# Patient Record
Sex: Female | Born: 1985 | Race: Black or African American | Hispanic: No | Marital: Single | Smoking: Never smoker
Health system: Southern US, Community
[De-identification: ages and names within clinical notes are randomized; demographics above are authoritative.]

## PROBLEM LIST (undated history)

## (undated) DIAGNOSIS — N200 Calculus of kidney: Secondary | ICD-10-CM

## (undated) DIAGNOSIS — Z87442 Personal history of urinary calculi: Secondary | ICD-10-CM

## (undated) DIAGNOSIS — G43909 Migraine, unspecified, not intractable, without status migrainosus: Secondary | ICD-10-CM

## (undated) DIAGNOSIS — D649 Anemia, unspecified: Secondary | ICD-10-CM

## (undated) DIAGNOSIS — M7989 Other specified soft tissue disorders: Secondary | ICD-10-CM

## (undated) DIAGNOSIS — M1990 Unspecified osteoarthritis, unspecified site: Secondary | ICD-10-CM

## (undated) HISTORY — DX: Unspecified osteoarthritis, unspecified site: M19.90

## (undated) HISTORY — DX: Anemia, unspecified: D64.9

---
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
## 2004-12-17 ENCOUNTER — Inpatient Hospital Stay (HOSPITAL_COMMUNITY): Admission: AD | Admit: 2004-12-17 | Discharge: 2004-12-17 | Payer: Self-pay | Admitting: Obstetrics & Gynecology

## 2005-02-24 ENCOUNTER — Ambulatory Visit (HOSPITAL_COMMUNITY): Admission: RE | Admit: 2005-02-24 | Discharge: 2005-02-24 | Payer: Self-pay | Admitting: *Deleted

## 2005-04-20 ENCOUNTER — Ambulatory Visit (HOSPITAL_COMMUNITY): Admission: RE | Admit: 2005-04-20 | Discharge: 2005-04-20 | Payer: Self-pay | Admitting: *Deleted

## 2005-05-03 ENCOUNTER — Inpatient Hospital Stay (HOSPITAL_COMMUNITY): Admission: AD | Admit: 2005-05-03 | Discharge: 2005-05-03 | Payer: Self-pay | Admitting: *Deleted

## 2005-05-17 ENCOUNTER — Ambulatory Visit (HOSPITAL_COMMUNITY): Admission: RE | Admit: 2005-05-17 | Discharge: 2005-05-17 | Payer: Self-pay | Admitting: *Deleted

## 2005-06-22 ENCOUNTER — Ambulatory Visit: Payer: Self-pay | Admitting: Obstetrics and Gynecology

## 2005-06-29 ENCOUNTER — Inpatient Hospital Stay (HOSPITAL_COMMUNITY): Admission: AD | Admit: 2005-06-29 | Discharge: 2005-07-01 | Payer: Self-pay | Admitting: Obstetrics and Gynecology

## 2005-06-29 ENCOUNTER — Ambulatory Visit: Payer: Self-pay | Admitting: Family Medicine

## 2006-10-05 ENCOUNTER — Emergency Department (HOSPITAL_COMMUNITY): Admission: EM | Admit: 2006-10-05 | Discharge: 2006-10-05 | Payer: Self-pay | Admitting: *Deleted

## 2007-03-09 ENCOUNTER — Emergency Department (HOSPITAL_COMMUNITY): Admission: EM | Admit: 2007-03-09 | Discharge: 2007-03-10 | Payer: Self-pay | Admitting: Emergency Medicine

## 2007-12-11 ENCOUNTER — Emergency Department (HOSPITAL_COMMUNITY): Admission: EM | Admit: 2007-12-11 | Discharge: 2007-12-11 | Payer: Self-pay | Admitting: Emergency Medicine

## 2007-12-29 ENCOUNTER — Emergency Department (HOSPITAL_COMMUNITY): Admission: EM | Admit: 2007-12-29 | Discharge: 2007-12-29 | Payer: Self-pay | Admitting: Emergency Medicine

## 2008-02-21 ENCOUNTER — Inpatient Hospital Stay (HOSPITAL_COMMUNITY): Admission: AD | Admit: 2008-02-21 | Discharge: 2008-02-21 | Payer: Self-pay | Admitting: Obstetrics & Gynecology

## 2008-05-11 ENCOUNTER — Inpatient Hospital Stay (HOSPITAL_COMMUNITY): Admission: AD | Admit: 2008-05-11 | Discharge: 2008-05-12 | Payer: Self-pay | Admitting: Obstetrics and Gynecology

## 2008-07-18 HISTORY — PX: TUBAL LIGATION: SHX77

## 2008-08-10 ENCOUNTER — Inpatient Hospital Stay (HOSPITAL_COMMUNITY): Admission: AD | Admit: 2008-08-10 | Discharge: 2008-08-10 | Payer: Self-pay | Admitting: Obstetrics & Gynecology

## 2008-10-23 ENCOUNTER — Ambulatory Visit (HOSPITAL_COMMUNITY): Admission: RE | Admit: 2008-10-23 | Discharge: 2008-10-23 | Payer: Self-pay | Admitting: Family Medicine

## 2008-11-03 ENCOUNTER — Observation Stay (HOSPITAL_COMMUNITY): Admission: AD | Admit: 2008-11-03 | Discharge: 2008-11-05 | Payer: Self-pay | Admitting: Obstetrics & Gynecology

## 2008-11-03 ENCOUNTER — Ambulatory Visit: Payer: Self-pay | Admitting: Obstetrics & Gynecology

## 2008-11-12 ENCOUNTER — Ambulatory Visit: Payer: Self-pay | Admitting: Obstetrics & Gynecology

## 2008-11-13 ENCOUNTER — Encounter: Payer: Self-pay | Admitting: Family

## 2008-12-03 ENCOUNTER — Encounter: Payer: Self-pay | Admitting: Family

## 2008-12-03 ENCOUNTER — Ambulatory Visit: Payer: Self-pay | Admitting: Obstetrics & Gynecology

## 2008-12-03 LAB — CONVERTED CEMR LAB
HCT: 33.9 % — ABNORMAL LOW (ref 36.0–46.0)
Hemoglobin: 10.9 g/dL — ABNORMAL LOW (ref 12.0–15.0)
MCHC: 32.2 g/dL (ref 30.0–36.0)
MCV: 84.1 fL (ref 78.0–100.0)
RBC: 4.03 M/uL (ref 3.87–5.11)
WBC: 6.3 10*3/uL (ref 4.0–10.5)

## 2008-12-24 ENCOUNTER — Ambulatory Visit: Payer: Self-pay | Admitting: Obstetrics & Gynecology

## 2008-12-25 ENCOUNTER — Ambulatory Visit: Payer: Self-pay | Admitting: Advanced Practice Midwife

## 2008-12-25 ENCOUNTER — Inpatient Hospital Stay (HOSPITAL_COMMUNITY): Admission: AD | Admit: 2008-12-25 | Discharge: 2008-12-26 | Payer: Self-pay | Admitting: Obstetrics & Gynecology

## 2009-01-07 ENCOUNTER — Ambulatory Visit: Payer: Self-pay | Admitting: Obstetrics & Gynecology

## 2009-01-07 ENCOUNTER — Encounter: Payer: Self-pay | Admitting: Obstetrics and Gynecology

## 2009-01-08 ENCOUNTER — Inpatient Hospital Stay (HOSPITAL_COMMUNITY): Admission: AD | Admit: 2009-01-08 | Discharge: 2009-01-08 | Payer: Self-pay | Admitting: Obstetrics & Gynecology

## 2009-01-08 ENCOUNTER — Ambulatory Visit: Payer: Self-pay | Admitting: Advanced Practice Midwife

## 2009-01-16 ENCOUNTER — Ambulatory Visit: Payer: Self-pay | Admitting: Obstetrics and Gynecology

## 2009-01-16 ENCOUNTER — Inpatient Hospital Stay (HOSPITAL_COMMUNITY): Admission: AD | Admit: 2009-01-16 | Discharge: 2009-01-17 | Payer: Self-pay | Admitting: Obstetrics & Gynecology

## 2009-01-21 ENCOUNTER — Ambulatory Visit: Payer: Self-pay | Admitting: Obstetrics & Gynecology

## 2009-02-04 ENCOUNTER — Ambulatory Visit: Payer: Self-pay | Admitting: Obstetrics & Gynecology

## 2009-02-04 ENCOUNTER — Encounter: Payer: Self-pay | Admitting: Obstetrics and Gynecology

## 2009-02-04 LAB — CONVERTED CEMR LAB
Chlamydia, DNA Probe: POSITIVE — AB
GC Probe Amp, Genital: NEGATIVE

## 2009-02-11 ENCOUNTER — Ambulatory Visit: Payer: Self-pay | Admitting: Obstetrics & Gynecology

## 2009-02-18 ENCOUNTER — Ambulatory Visit: Payer: Self-pay | Admitting: Obstetrics & Gynecology

## 2009-02-25 ENCOUNTER — Ambulatory Visit: Payer: Self-pay | Admitting: Obstetrics & Gynecology

## 2009-03-02 ENCOUNTER — Ambulatory Visit: Payer: Self-pay | Admitting: Obstetrics & Gynecology

## 2009-03-03 ENCOUNTER — Ambulatory Visit: Payer: Self-pay | Admitting: Obstetrics & Gynecology

## 2009-03-03 ENCOUNTER — Inpatient Hospital Stay (HOSPITAL_COMMUNITY): Admission: RE | Admit: 2009-03-03 | Discharge: 2009-03-06 | Payer: Self-pay | Admitting: Family Medicine

## 2010-08-07 ENCOUNTER — Emergency Department (HOSPITAL_COMMUNITY)
Admission: EM | Admit: 2010-08-07 | Discharge: 2010-08-07 | Payer: Self-pay | Source: Home / Self Care | Admitting: Family Medicine

## 2010-09-27 ENCOUNTER — Ambulatory Visit (INDEPENDENT_AMBULATORY_CARE_PROVIDER_SITE_OTHER): Payer: Self-pay

## 2010-09-27 ENCOUNTER — Inpatient Hospital Stay (INDEPENDENT_AMBULATORY_CARE_PROVIDER_SITE_OTHER)
Admission: RE | Admit: 2010-09-27 | Discharge: 2010-09-27 | Disposition: A | Discharge status: Home / Self Care | Payer: Self-pay | Source: Ambulatory Visit | Attending: Family Medicine | Admitting: Family Medicine

## 2010-09-27 DIAGNOSIS — M67919 Unspecified disorder of synovium and tendon, unspecified shoulder: Secondary | ICD-10-CM

## 2010-09-27 DIAGNOSIS — M719 Bursopathy, unspecified: Secondary | ICD-10-CM

## 2010-10-23 LAB — POCT URINALYSIS DIP (DEVICE)
Ketones, ur: NEGATIVE mg/dL
Nitrite: NEGATIVE
Protein, ur: 30 mg/dL — AB
Protein, ur: NEGATIVE mg/dL
Specific Gravity, Urine: 1.01 (ref 1.005–1.030)
Urobilinogen, UA: 0.2 mg/dL (ref 0.0–1.0)
Urobilinogen, UA: 0.2 mg/dL (ref 0.0–1.0)
pH: 7 (ref 5.0–8.0)

## 2010-10-23 LAB — RH IMMUNE GLOB WKUP(>/=20WKS)(NOT WOMEN'S HOSP): Fetal Screen: NEGATIVE

## 2010-10-23 LAB — CBC
HCT: 29.8 % — ABNORMAL LOW (ref 36.0–46.0)
Hemoglobin: 9.7 g/dL — ABNORMAL LOW (ref 12.0–15.0)
RBC: 3.8 MIL/uL — ABNORMAL LOW (ref 3.87–5.11)
RDW: 16.4 % — ABNORMAL HIGH (ref 11.5–15.5)
WBC: 9.1 10*3/uL (ref 4.0–10.5)

## 2010-10-24 LAB — URINE CULTURE
Colony Count: 5000
Special Requests: NEGATIVE

## 2010-10-24 LAB — COMPREHENSIVE METABOLIC PANEL
ALT: 10 U/L (ref 0–35)
CO2: 25 mEq/L (ref 19–32)
Calcium: 8.7 mg/dL (ref 8.4–10.5)
GFR calc non Af Amer: >60 mL/min (ref >60)
Glucose, Bld: 76 mg/dL (ref 70–99)
Sodium: 134 mEq/L — ABNORMAL LOW (ref 135–145)

## 2010-10-24 LAB — POCT URINALYSIS DIP (DEVICE)
Bilirubin Urine: NEGATIVE
Glucose, UA: NEGATIVE mg/dL
Ketones, ur: NEGATIVE mg/dL
Nitrite: NEGATIVE
Nitrite: NEGATIVE
Nitrite: NEGATIVE
Protein, ur: NEGATIVE mg/dL
Protein, ur: NEGATIVE mg/dL
Specific Gravity, Urine: 1.015 (ref 1.005–1.030)
Urobilinogen, UA: 0.2 mg/dL (ref 0.0–1.0)
Urobilinogen, UA: 0.2 mg/dL (ref 0.0–1.0)
Urobilinogen, UA: 0.2 mg/dL (ref 0.0–1.0)
pH: 7 (ref 5.0–8.0)
pH: 7 (ref 5.0–8.0)
pH: 7 (ref 5.0–8.0)

## 2010-10-24 LAB — URINALYSIS, ROUTINE W REFLEX MICROSCOPIC
Bilirubin Urine: NEGATIVE
Glucose, UA: NEGATIVE mg/dL
Ketones, ur: NEGATIVE mg/dL
Leukocytes, UA: NEGATIVE
Nitrite: NEGATIVE
Protein, ur: 30 mg/dL — AB
Specific Gravity, Urine: 1.015 (ref 1.005–1.030)
Urobilinogen, UA: 0.2 mg/dL (ref 0.0–1.0)
pH: 7 (ref 5.0–8.0)

## 2010-10-24 LAB — CBC
HCT: 31.2 % — ABNORMAL LOW (ref 36.0–46.0)
Hemoglobin: 10.2 g/dL — ABNORMAL LOW (ref 12.0–15.0)
MCHC: 32.8 g/dL (ref 30.0–36.0)
MCV: 82.2 fL (ref 78.0–100.0)
Platelets: 193 K/uL (ref 150–400)
RBC: 3.8 MIL/uL — ABNORMAL LOW (ref 3.87–5.11)
RDW: 14.1 % (ref 11.5–15.5)
WBC: 11 K/uL — ABNORMAL HIGH (ref 4.0–10.5)

## 2010-10-24 LAB — URINE MICROSCOPIC-ADD ON

## 2010-10-25 LAB — URINALYSIS, ROUTINE W REFLEX MICROSCOPIC
Bilirubin Urine: NEGATIVE
Protein, ur: 30 mg/dL — AB
Urobilinogen, UA: 0.2 mg/dL (ref 0.0–1.0)

## 2010-10-25 LAB — WET PREP, GENITAL
Clue Cells Wet Prep HPF POC: NONE SEEN
Yeast Wet Prep HPF POC: NONE SEEN

## 2010-10-25 LAB — URINE CULTURE: Colony Count: 15000

## 2010-10-25 LAB — URINE MICROSCOPIC-ADD ON

## 2010-10-25 LAB — POCT URINALYSIS DIP (DEVICE)
Nitrite: NEGATIVE
Protein, ur: 30 mg/dL — AB
Specific Gravity, Urine: 1.015 (ref 1.005–1.030)
Urobilinogen, UA: 0.2 mg/dL (ref 0.0–1.0)
Urobilinogen, UA: 0.2 mg/dL (ref 0.0–1.0)
pH: 6.5 (ref 5.0–8.0)
pH: 7 (ref 5.0–8.0)

## 2010-10-26 LAB — POCT URINALYSIS DIP (DEVICE)
Bilirubin Urine: NEGATIVE
Glucose, UA: NEGATIVE mg/dL
Nitrite: NEGATIVE

## 2010-10-27 LAB — URINALYSIS, ROUTINE W REFLEX MICROSCOPIC
Leukocytes, UA: NEGATIVE
Nitrite: NEGATIVE
Specific Gravity, Urine: 1.01 (ref 1.005–1.030)
Urobilinogen, UA: 0.2 mg/dL (ref 0.0–1.0)

## 2010-10-27 LAB — COMPREHENSIVE METABOLIC PANEL
Albumin: 2.6 g/dL — ABNORMAL LOW (ref 3.5–5.2)
BUN: 3 mg/dL — ABNORMAL LOW (ref 6–23)
CO2: 26 mEq/L (ref 19–32)
Chloride: 105 mEq/L (ref 96–112)
Creatinine, Ser: 0.52 mg/dL (ref 0.4–1.2)
GFR calc non Af Amer: >60 mL/min (ref >60)
Total Bilirubin: 0.3 mg/dL (ref 0.3–1.2)

## 2010-10-27 LAB — CBC
HCT: 31.3 % — ABNORMAL LOW (ref 36.0–46.0)
MCHC: 33.5 g/dL (ref 30.0–36.0)
MCV: 87.1 fL (ref 78.0–100.0)
Platelets: 177 10*3/uL (ref 150–400)
WBC: 8.4 10*3/uL (ref 4.0–10.5)

## 2010-10-27 LAB — POCT URINALYSIS DIP (DEVICE)
Bilirubin Urine: NEGATIVE
Ketones, ur: NEGATIVE mg/dL

## 2010-10-27 LAB — URINE CULTURE: Culture: NO GROWTH

## 2010-10-27 LAB — URINE MICROSCOPIC-ADD ON

## 2010-11-01 LAB — URINALYSIS, ROUTINE W REFLEX MICROSCOPIC
Nitrite: NEGATIVE
Specific Gravity, Urine: 1.025 (ref 1.005–1.030)
pH: 6 (ref 5.0–8.0)

## 2010-11-01 LAB — URINE MICROSCOPIC-ADD ON

## 2010-11-01 LAB — WET PREP, GENITAL: Trich, Wet Prep: NONE SEEN

## 2010-11-01 LAB — CBC
MCV: 85.6 fL (ref 78.0–100.0)
RBC: 4.91 MIL/uL (ref 3.87–5.11)
WBC: 6.4 10*3/uL (ref 4.0–10.5)

## 2010-11-01 LAB — DIFFERENTIAL
Eosinophils Absolute: 0 10*3/uL (ref 0.0–0.7)
Lymphs Abs: 1.6 10*3/uL (ref 0.7–4.0)
Monocytes Relative: 8 % (ref 3–12)
Neutrophils Relative %: 67 % (ref 43–77)

## 2010-11-01 LAB — URINE CULTURE

## 2010-11-01 LAB — BASIC METABOLIC PANEL
Chloride: 103 mEq/L (ref 96–112)
Creatinine, Ser: 0.68 mg/dL (ref 0.4–1.2)
GFR calc Af Amer: >60 mL/min (ref >60)
Potassium: 3.4 mEq/L — ABNORMAL LOW (ref 3.5–5.1)

## 2010-11-01 LAB — GC/CHLAMYDIA PROBE AMP, GENITAL
Chlamydia, DNA Probe: NEGATIVE
GC Probe Amp, Genital: NEGATIVE

## 2010-11-30 NOTE — Discharge Summary (Signed)
NAME:  Audrey Gonzalez, Audrey Gonzalez                ACCOUNT NO.:  [id number]   MEDICAL RECORD NO.:  [id number]          PATIENT TYPE:  OBV   LOCATION:  9196                          FACILITY:  [facility]   PHYSICIAN:  Tanya S. Shawnie Pons, M.D.   DATE OF BIRTH:  Jan 31, 1986   DATE OF ADMISSION:  11/03/2008  DATE OF DISCHARGE:  11/05/2008                               DISCHARGE SUMMARY   DISCHARGE DIAGNOSES:  1. Intrauterine pregnancy at 40 weeks' gestational age.  2. Right nephrolithiasis.   REASON FOR ADMISSION:  Ms. Audrey Gonzalez is a 25 year old gravida 3, para  2-0-0-2 who presented to the [facility]U November 03, 2008 complaining of 3-day  history of colicky right flank pain and denied dysuria or hematuria.  Of  note, she had a kidney stone during her last pregnancy, which she notes  to pass spontaneously.  Given the degree of pain finding of moderate  urine hemoglobin and 11-20 rbc's on urinalysis.  The patient was  admitted with diagnosis of right nephrolithiasis.  She was admitted for  pain control and further evaluation.   HOSPITAL COURSE:  The patient was admitted and received p.o. and IV  narcotics as well as appropriate IV hydration.  A renal ultrasound was  performed, which revealed mild bilateral renal pyelectasis and a 1.2 cm  calculus in the lower pole of the right kidney.  Both her bilateral  ureteral jets were visualized.  After approximately 36 hours of  admission, the patient's pain was better control.  She was tolerating  p.o. and was requesting discharge.   Findings as per hospital course.   MEDICATIONS AT DISCHARGE:  1. Prenatal vitamins.  2. Percocet 1-2 tablets every 6 hours as needed.  She was given a      prescription for 40.  3. Phenergan 25 mg every 6 hours as needed for nausea.  She was given      a prescription for 30.   DISCHARGE INSTRUCTIONS:  Instruction for the patient.  The patient was  given instructions on the use of her medications.  Additionally she was  scheduled for an  appointment in the [facility] for November 12, 2008 at 0945 in the morning.  She was also given appointment to see  [facility] for further recommendations  also on November 12, 2008 at 3 p.m.  The patient was given a note to return  to work as well as some mild work restrictions and we discussed  returning to [facility]U for increased pain as well as inability to tolerate  p.o.   CONDITION ON DISCHARGE:  Good.      Odie Sera, DO  Electronically Signed     ______________________________  Shelbie Proctor. Shawnie Pons, M.D.    MC/MEDQ  D:  11/05/2008  T:  11/05/2008  Job:  [id number]

## 2010-11-30 NOTE — Discharge Summary (Signed)
NAME:  Audrey Gonzalez, Audrey Gonzalez                ACCOUNT NO.:  [id number]   MEDICAL RECORD NO.:  [id number]          PATIENT TYPE:  INP   LOCATION:  9304                          [facility]ACILITY:  [facility]   PHYSICIAN:  Tilda Burrow, M.D. DATE OF BIRTH:  01-Dec-1985   DATE OF ADMISSION:  01/16/2009  DATE OF DISCHARGE:  01/17/2009                               DISCHARGE SUMMARY   ADMITTING DIAGNOSES:  Right flank pain secondary to nephrolithiasis,  bilateral nephrolithiasis, right greater than left, pregnancy 34 weeks 2  days not delivered.   DISCHARGE DIAGNOSES:  Right flank pain secondary to nephrolithiasis,  bilateral nephrolithiasis, right greater than left, pregnancy 34 weeks 2  days not delivered, stable.   DISCHARGE MEDICATIONS:  1. Dilaudid 2 mg tablets #21 p.o. q.6 h p.r.n. severe pain.  2. Phenergan 25 mg 1 p.o. q.6 h p.r.n., mild to moderate pain.  3. Phenergan 25 mg p.o. q.6 h p.r.n. nausea, vomiting.  4. Prenatal vitamins 1 p.o. daily.   HOSPITAL SUMMARY:  This 25 year old single African American female  gravida 3, para 2-0-0-2, LMP May 20, 2008 with menstrual [facility] February 24, 2009 and March 08, 2009, based on a 20-week 4-day ultrasound on  October 23, 2008.  She was admitted at 34 weeks 2 days by best criteria due  to increasing right flank pain associated with her known  nephrolithiasis.  She is followed by [facility].  Renal  ultrasounds have shown bilateral stones with 1.2 cm or LP stone.  She is  having intermittent moderate to severe pain with periodic nausea and  vomiting.  She is admitted for pain management hospital.   The hospital course included a urinalysis showing small amount of  hemoglobin, negative ketones, 1+ protein, negative nitrates, negative  leukocyte esterase with 0-2 white cells.  Urine culture is pending.   Laboratory data includes BUN 5, creatinine 0.59 with CMET, otherwise  unremarkable.  Hemoglobin is 10.2, hematocrit 31, white count 11,000,  platelets  193,000.  Renal ultrasounds obtained which shows no evidence  of ureteral obstruction.   Pain management and nonstress tests were reactive daily x2.  The  patient's pain management consisted of IV medications initially and then  conversion back to Percocet.  Pain remained in the 7-8/10 range.  She  felt only slightly better, but was considered to able to tolerate p.o.  foods and fluids, and is discharged home on January 17, 2009 for resumption  of her ongoing outpatient care with followup January 21, 2009 at the [facility].  Dilaudid is added for the acute exacerbations of pain.  The patient knows to return for fever, traumatic increasing pain, or  inability to keep p.o. fluids down.     Tilda Burrow, M.D.  Electronically Signed    JVF/MEDQ  D:  01/17/2009  T:  01/18/2009  Job:  [id number]

## 2010-11-30 NOTE — Op Note (Signed)
NAME:  Audrey Gonzalez, Audrey Gonzalez                ACCOUNT NO.:  [id number]   MEDICAL RECORD NO.:  [id number]           PATIENT TYPE:   LOCATION:                                 FACILITY:   PHYSICIAN:  Catalina Antigua, MD     DATE OF BIRTH:  12-03-1985   DATE OF PROCEDURE:  DATE OF DISCHARGE:                               OPERATIVE REPORT   PREOPERATIVE DIAGNOSIS:  A 25 year old para 3-0-0-3 status post normal  spontaneous vaginal delivery for permanent sterilization.   POSTOPERATIVE DIAGNOSIS:  A 25 year old para 3-0-0-3 status post normal  spontaneous vaginal delivery for permanent sterilization.   PROCEDURE:  Postpartum tubal ligation using Filshie clips.   SURGEON:  Catalina Antigua, MD   ASSISTANT:  Scheryl Darter, MD   ANESTHESIA:  Epidural.   COMPLICATIONS:  None.   ESTIMATED BLOOD LOSS:  Minimal.   FINDINGS:  Normal postpartum uterus and bilateral tubes.   PROCEDURE:  The patient was taken to the operating room where her  epidural was found to be adequate.  Marcaine was injected at the site of  the future incision.  A small transverse infraumbilical skin incision  was then made with a scalpel.  The incision was carried down to the  underlying fascia until the perineum was identified and entered.  The  perineum was noted to be free of any adhesion.  The incision was then  extended with the Metzenbaum scissors.  The patient's left fallopian  tube was then identified, brought to the incision and grasped with  Babcock clamp.  The tube was then followed out to the fimbria.  The  Babcock clamp was then used to grasp the tube approximately 4 cm from  the cornual region.  The Filshie clip was then applied with good results  and the tube was returned to the abdomen.  In a similar fashion, the  right fallopian tube was then identified and a Filshie clip was applied.  The tube was returned to the abdomen.  The perineum and fascia were then  closed in a single layer using 0 Vicryl.  The skin was  closed in  subcuticular fashion using 3-0 Vicryl.  The patient tolerated the  procedure well.  Sponge, lap, and needle count were correct x2.  The  patient was taken to recovery room in stable condition.  No specimens  were collected and no specimens were sent to Pathology.     Catalina Antigua, MD  Electronically Signed    PC/MEDQ  D:  03/04/2009  T:  03/04/2009  Job:  [id number]

## 2011-04-14 ENCOUNTER — Emergency Department (HOSPITAL_COMMUNITY)
Admission: EM | Admit: 2011-04-14 | Discharge: 2011-04-14 | Disposition: A | Discharge status: Home / Self Care | Payer: No Typology Code available for payment source | Attending: Emergency Medicine | Admitting: Emergency Medicine

## 2011-04-14 ENCOUNTER — Emergency Department (HOSPITAL_COMMUNITY): Payer: Self-pay

## 2011-04-14 DIAGNOSIS — T07XXXA Unspecified multiple injuries, initial encounter: Secondary | ICD-10-CM | POA: Insufficient documentation

## 2011-04-14 DIAGNOSIS — R071 Chest pain on breathing: Secondary | ICD-10-CM | POA: Insufficient documentation

## 2011-04-14 DIAGNOSIS — R51 Headache: Secondary | ICD-10-CM | POA: Insufficient documentation

## 2011-04-14 DIAGNOSIS — M542 Cervicalgia: Secondary | ICD-10-CM | POA: Insufficient documentation

## 2011-04-14 LAB — URINALYSIS, ROUTINE W REFLEX MICROSCOPIC
Bilirubin Urine: NEGATIVE
Hgb urine dipstick: NEGATIVE
Ketones, ur: NEGATIVE
Protein, ur: 30 — AB
Urobilinogen, UA: 1

## 2011-04-14 LAB — DIFFERENTIAL
Eosinophils Relative: 0
Lymphocytes Relative: 8 — ABNORMAL LOW
Lymphs Abs: 0.6 — ABNORMAL LOW
Monocytes Absolute: 0.6
Neutro Abs: 6.2

## 2011-04-14 LAB — CBC
HCT: 41.5
Hemoglobin: 13.7
RBC: 5.06
WBC: 7.5

## 2011-04-14 LAB — POCT I-STAT, CHEM 8
BUN: 15
Calcium, Ion: 1.1 — ABNORMAL LOW
Chloride: 103
Creatinine, Ser: 1.1
TCO2: 22

## 2011-04-14 LAB — URINE MICROSCOPIC-ADD ON

## 2011-04-18 LAB — URINE MICROSCOPIC-ADD ON

## 2011-04-18 LAB — URINALYSIS, ROUTINE W REFLEX MICROSCOPIC
Glucose, UA: NEGATIVE
Ketones, ur: NEGATIVE
Leukocytes, UA: NEGATIVE
Nitrite: NEGATIVE
Specific Gravity, Urine: 1.015
pH: 6.5

## 2011-04-23 IMAGING — US US RENAL
1 series · 14 of 25 positions shown · non-contrast
Comparison: Ultrasound renal 11/04/2008

CLINICAL DATA: History kidney stones, left flank pain, the patient
is 35 weeks pregnant

RENAL/URINARY TRACT ULTRASOUND COMPLETE

[Series 1: us renal · 0.25mm/px · 14 of 54 slices shown]
[im 1/54]
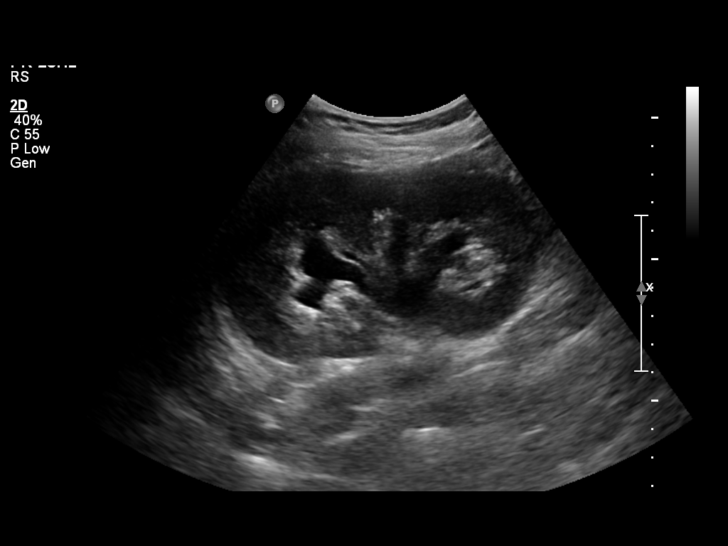
[im 5/54]
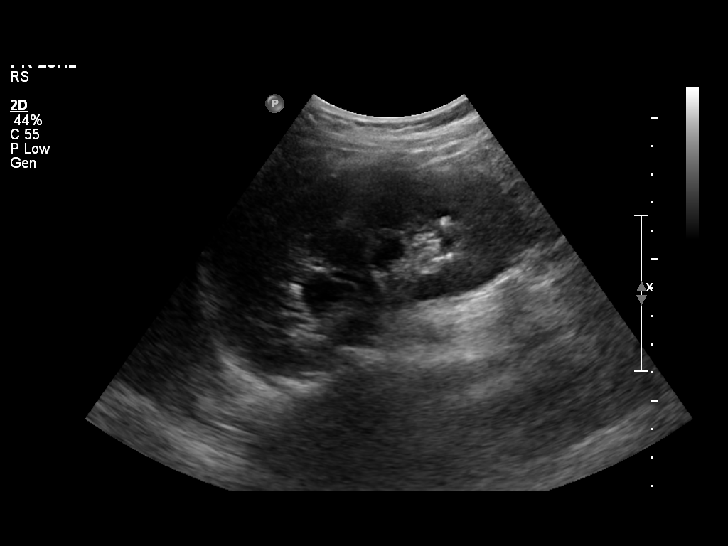
[im 9/54]
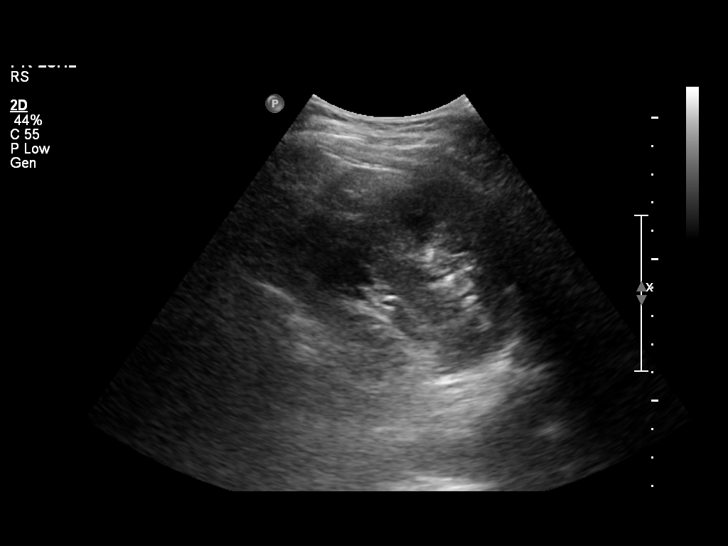
[im 14/54]
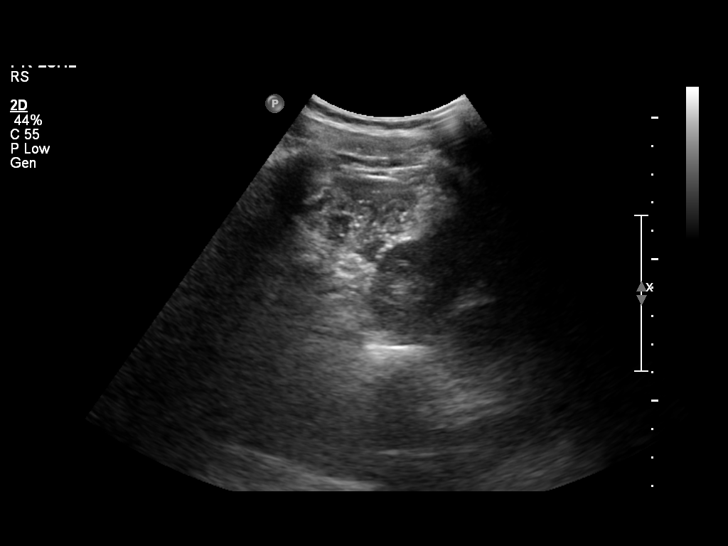
[im 18/54]
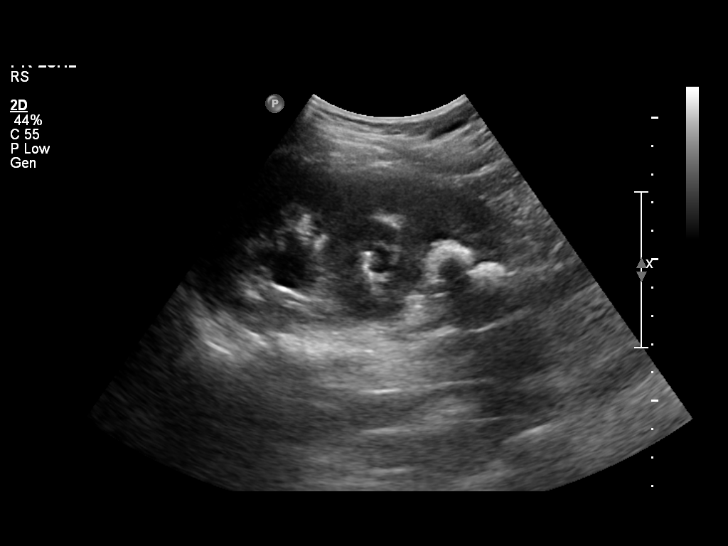
[im 20/54]
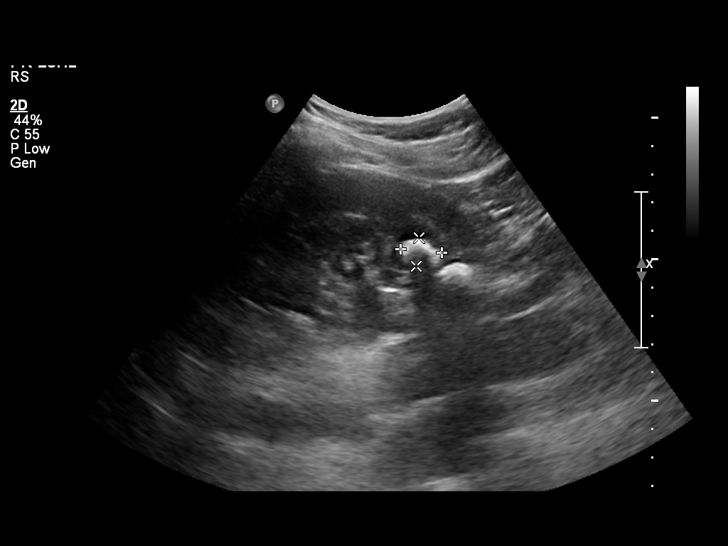
[im 25/54]
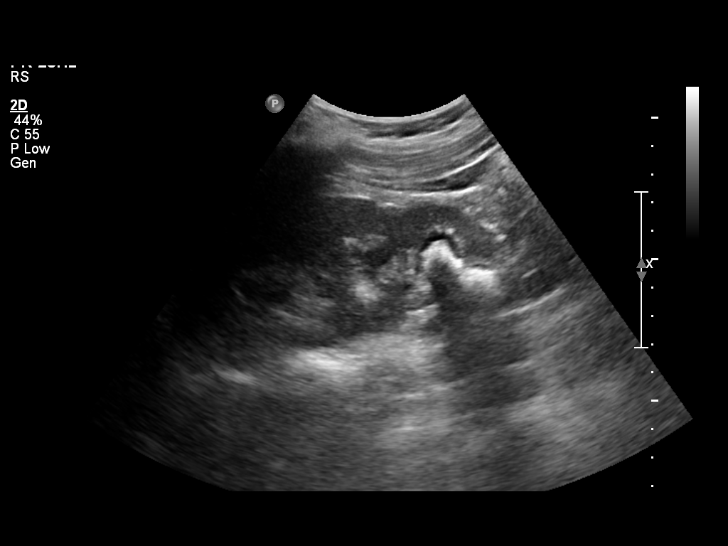
[im 29/54]
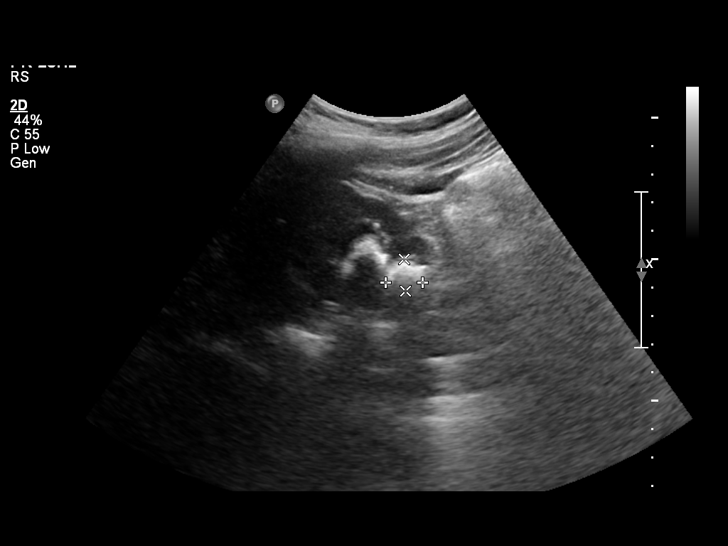
[im 34/54]
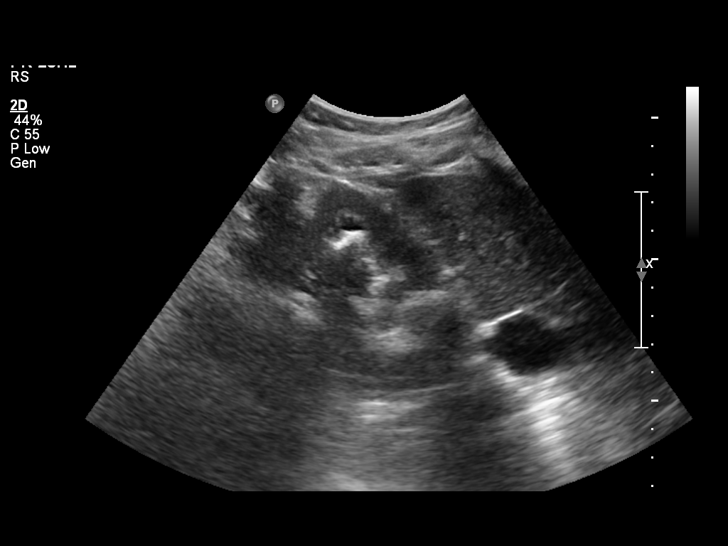
[im 36/54]
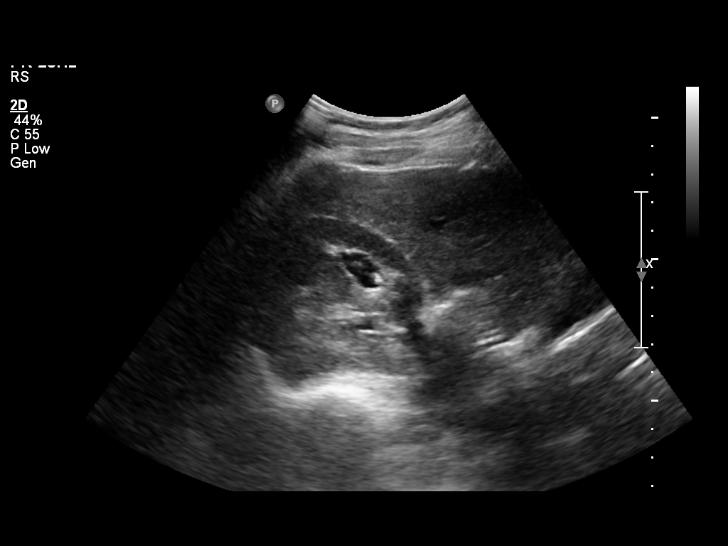
[im 40/54]
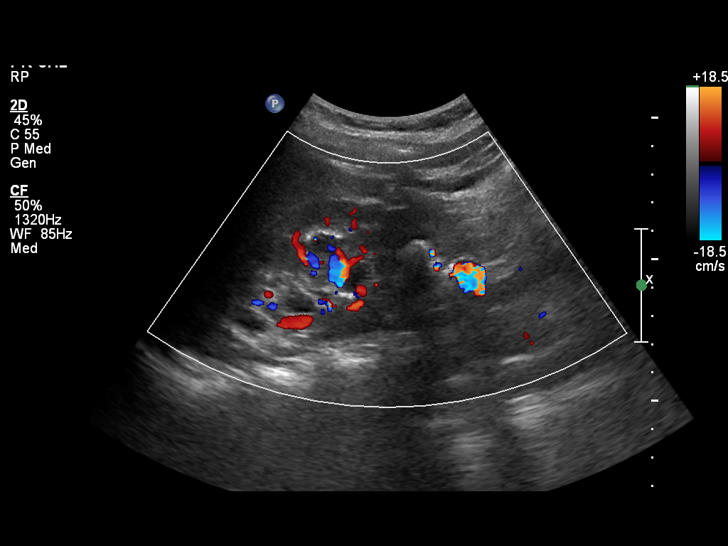
[im 45/54]
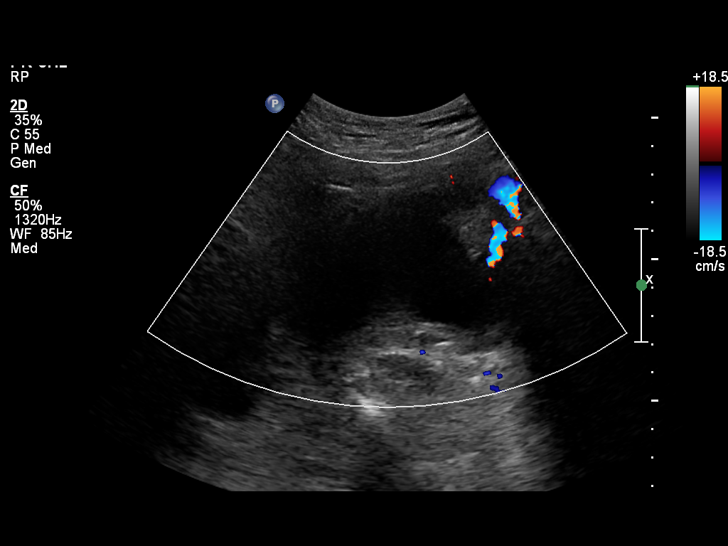
[im 49/54]
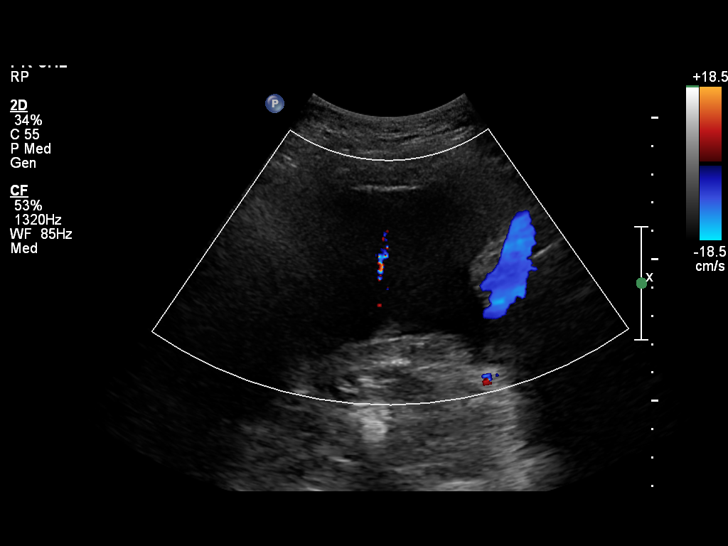
[im 54/54]
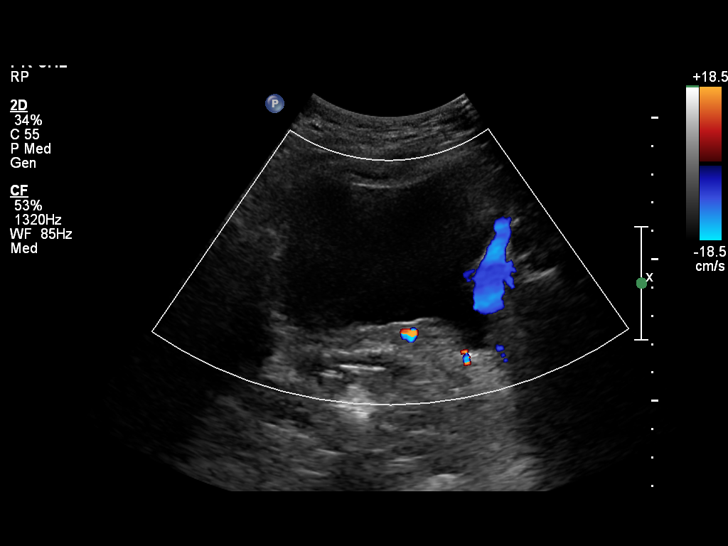

[14 of 25 positions shown; findings below may reference images not displayed]

FINDINGS: Right Kidney:  There is little change and mild hydronephrosis on
the right.  The right kidney measures 12.0 cm sagittally.  There do
appear to be calculi in the lower pole collecting system of the
right kidney one measuring 1.5 x 1.0 x 1.3 cm and a second
measuring 1.3 x 1.1 x 1.5 cm.

Left Kidney:  Mild left hydronephrosis again is noted.  The left
kidney measures 11.4 cm.

Bladder:  The urinary bladder is unremarkable.  A right ureteral
jet is seen.  No definite left ureteral jet can be identified.
IMPRESSION: 1.  There is little overall change in the degree of bilateral mild
hydronephrosis compared to the study of 11/04/2008.
2.  No change in lower pole right renal calculi.
3.  Right ureteral jet is seen.  No definite left ureteral jet is
noted.

## 2012-08-28 ENCOUNTER — Emergency Department (HOSPITAL_COMMUNITY)
Admission: EM | Admit: 2012-08-28 | Discharge: 2012-08-28 | Disposition: A | Discharge status: Home / Self Care | Payer: Self-pay | Attending: Emergency Medicine | Admitting: Emergency Medicine

## 2012-08-28 ENCOUNTER — Encounter (HOSPITAL_COMMUNITY): Payer: Self-pay | Admitting: Emergency Medicine

## 2012-08-28 DIAGNOSIS — M25519 Pain in unspecified shoulder: Secondary | ICD-10-CM | POA: Insufficient documentation

## 2012-08-28 DIAGNOSIS — Z79899 Other long term (current) drug therapy: Secondary | ICD-10-CM | POA: Insufficient documentation

## 2012-08-28 DIAGNOSIS — Z87442 Personal history of urinary calculi: Secondary | ICD-10-CM | POA: Insufficient documentation

## 2012-08-28 DIAGNOSIS — M2550 Pain in unspecified joint: Secondary | ICD-10-CM | POA: Insufficient documentation

## 2012-08-28 HISTORY — DX: Calculus of kidney: N20.0

## 2012-08-28 MED ORDER — HYDROCODONE-ACETAMINOPHEN 5-325 MG PO TABS: 1.0000 | ORAL_TABLET | Freq: Four times a day (QID) | ORAL | Status: DC | PRN

## 2012-08-28 MED ORDER — CYCLOBENZAPRINE HCL 10 MG PO TABS
5.0000 mg | ORAL_TABLET | Freq: Once | ORAL | Status: AC
  Administered 2012-08-28: 5 mg via ORAL
  Filled 2012-08-28: qty 1

## 2012-08-28 MED ORDER — CYCLOBENZAPRINE HCL 5 MG PO TABS: 5.0000 mg | ORAL_TABLET | Freq: Two times a day (BID) | ORAL | Status: DC | PRN

## 2012-08-28 MED ORDER — HYDROCODONE-ACETAMINOPHEN 5-325 MG PO TABS
1.0000 | ORAL_TABLET | Freq: Once | ORAL | Status: AC
  Administered 2012-08-28: 1 via ORAL
  Filled 2012-08-28: qty 1

## 2012-08-28 NOTE — ED Notes (Signed)
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

## 2012-08-28 NOTE — ED Provider Notes (Signed)
Medical screening examination/treatment/procedure(s) were performed by non-physician practitioner and as supervising physician I was immediately available for consultation/collaboration.  Tarica Harl, MD 08/28/12 0641 

## 2012-08-28 NOTE — ED Provider Notes (Signed)
History     CSN: [id number]  Arrival date & time 08/28/12  0150   First MD Initiated Contact with Patient 08/28/12 0247      Chief Complaint  Patient presents with  . Shoulder Pain    (Consider location/radiation/quality/duration/timing/severity/associated sxs/prior treatment) HPI  Past Medical History  Diagnosis Date  . Kidney stones     Past Surgical History  Procedure Laterality Date  . Tubal ligation      No family history on file.  History  Substance Use Topics  . Smoking status: Never Smoker   . Smokeless tobacco: Not on file  . Alcohol Use: No    OB History   Grav Para Term Preterm Abortions TAB SAB Ect Mult Living                  Review of Systems  Allergies  Review of patient's allergies indicates no known allergies.  Home Medications   Current Outpatient Rx  Name  Route  Sig  Dispense  Refill  . naproxen sodium (ANAPROX) 220 MG tablet   Oral   Take 220 mg by mouth 2 (two) times daily with a meal.         . cyclobenzaprine (FLEXERIL) 5 MG tablet   Oral   Take 1 tablet (5 mg total) by mouth 2 (two) times daily as needed for muscle spasms.   30 tablet   0   . HYDROcodone-acetaminophen (NORCO/VICODIN) 5-325 MG per tablet   Oral   Take 1 tablet by mouth every 6 (six) hours as needed for pain.   20 tablet   0     BP 111/73  Pulse 75  Temp(Src) 98 F (36.7 C) (Oral)  Resp 18  SpO2 100%  LMP 08/12/2012  Physical Exam  ED Course  Procedures (including critical care time)  Labs Reviewed - No data to display No results found.   1. Shoulder pain       MDM  Is with chronic, recurrent left shoulder pain.  Has not been evaluated by, orthopedics.  We'll treat with Flexeril, and Vicodin, and referred to orthopedics for followup        Arman Filter, NP 08/28/12 [phone]  Arman Filter, NP 08/28/12 [phone]

## 2012-08-28 NOTE — ED Provider Notes (Signed)
History     CSN: [id number]  Arrival date & time 08/28/12  0150   First MD Initiated Contact with Patient 08/28/12 0247      Chief Complaint  Patient presents with  . Shoulder Pain    (Consider location/radiation/quality/duration/timing/severity/associated sxs/prior treatment) HPI Comments: Patient with approximately 18 month history of left shoulder pain that ha swaxed and waned in intensity Usually she can take Ibuprofen or Naprosyn with relief, not today   Patient is a 27 y.o. female presenting with shoulder pain. The history is provided by the patient.  Shoulder Pain This is a recurrent problem. The current episode started today. The problem occurs constantly. The problem has been gradually worsening. Associated symptoms include arthralgias. Pertinent negatives include no coughing, fever, joint swelling, myalgias, neck pain, numbness, rash or weakness. The symptoms are aggravated by exertion. She has tried NSAIDs for the symptoms. The treatment provided no relief.    Past Medical History  Diagnosis Date  . Kidney stones     Past Surgical History  Procedure Laterality Date  . Tubal ligation      No family history on file.  History  Substance Use Topics  . Smoking status: Never Smoker   . Smokeless tobacco: Not on file  . Alcohol Use: No    OB History   Grav Para Term Preterm Abortions TAB SAB Ect Mult Living                  Review of Systems  Constitutional: Negative for fever.  HENT: Negative for rhinorrhea and neck pain.   Respiratory: Negative for cough and shortness of breath.   Musculoskeletal: Positive for arthralgias. Negative for myalgias and joint swelling.  Skin: Negative for rash.  Neurological: Negative for dizziness, weakness and numbness.    Allergies  Review of patient's allergies indicates no known allergies.  Home Medications   Current Outpatient Rx  Name  Route  Sig  Dispense  Refill  . naproxen sodium (ANAPROX) 220 MG tablet  Oral   Take 220 mg by mouth 2 (two) times daily with a meal.         . cyclobenzaprine (FLEXERIL) 5 MG tablet   Oral   Take 1 tablet (5 mg total) by mouth 2 (two) times daily as needed for muscle spasms.   30 tablet   0   . HYDROcodone-acetaminophen (NORCO/VICODIN) 5-325 MG per tablet   Oral   Take 1 tablet by mouth every 6 (six) hours as needed for pain.   20 tablet   0     BP 111/73  Pulse 75  Temp(Src) 98 F (36.7 C) (Oral)  Resp 18  SpO2 100%  LMP 08/12/2012  Physical Exam  Constitutional: She is oriented to person, place, and time. She appears well-developed and well-nourished.  HENT:  Head: Normocephalic.  Eyes: Pupils are equal, round, and reactive to light.  Neck: Normal range of motion.  Cardiovascular: Normal rate.   Pulmonary/Chest: Effort normal.  Musculoskeletal: She exhibits tenderness. She exhibits no edema.       Left shoulder: She exhibits decreased range of motion, tenderness and pain. She exhibits no bony tenderness, no swelling, no effusion, no crepitus, no deformity and no laceration.       Arms: Tender in the joint with decreases ROM full ROM of elbow strong distal pulses  Neurological: She is alert and oriented to person, place, and time.  Skin: Skin is warm. No rash noted. No pallor.    ED Course  Procedures (including critical care time)  Labs Reviewed - No data to display No results found.   1. Shoulder pain       MDM  Will treat with increased pain  control and refer patinet to ortho for follow up evaluation         Arman Filter, NP 08/28/12 0336  Arman Filter, NP 08/28/12 [phone]

## 2012-08-28 NOTE — ED Provider Notes (Signed)
Medical screening examination/treatment/procedure(s) were performed by non-physician practitioner and as supervising physician I was immediately available for consultation/collaboration.  Sunnie Nielsen, MD 08/28/12 [phone]

## 2013-12-07 ENCOUNTER — Emergency Department (HOSPITAL_COMMUNITY): Payer: Self-pay

## 2013-12-07 ENCOUNTER — Encounter (HOSPITAL_COMMUNITY): Payer: Self-pay | Admitting: Emergency Medicine

## 2013-12-07 ENCOUNTER — Emergency Department (HOSPITAL_COMMUNITY)
Admission: EM | Admit: 2013-12-07 | Discharge: 2013-12-07 | Disposition: A | Discharge status: Home / Self Care | Payer: Self-pay | Attending: Emergency Medicine | Admitting: Emergency Medicine

## 2013-12-07 DIAGNOSIS — M65839 Other synovitis and tenosynovitis, unspecified forearm: Secondary | ICD-10-CM | POA: Insufficient documentation

## 2013-12-07 DIAGNOSIS — M65849 Other synovitis and tenosynovitis, unspecified hand: Principal | ICD-10-CM

## 2013-12-07 DIAGNOSIS — Z87442 Personal history of urinary calculi: Secondary | ICD-10-CM | POA: Insufficient documentation

## 2013-12-07 DIAGNOSIS — Z791 Long term (current) use of non-steroidal anti-inflammatories (NSAID): Secondary | ICD-10-CM | POA: Insufficient documentation

## 2013-12-07 DIAGNOSIS — M779 Enthesopathy, unspecified: Secondary | ICD-10-CM

## 2013-12-07 MED ORDER — TRAMADOL HCL 50 MG PO TABS: 50.0000 mg | ORAL_TABLET | Freq: Four times a day (QID) | ORAL | Status: DC | PRN

## 2013-12-07 NOTE — ED Provider Notes (Signed)
CSN: [id number]     Arrival date & time 12/07/13  1329 History  This chart was scribed for non-physician practitioner, Audrey LowerVrinda Annaleigha Woo, FNP,working with Audrey ChickMartha K Linker, MD, by Audrey Gonzalez, ED Scribe.  This patient was seen in room WTR7/WTR7 and the patient's care was started at 1:47 PM.  Chief Complaint  Patient presents with  . Wrist Pain   The history is provided by the patient. No language interpreter was used.   HPI Comments:  Audrey Gonzalez is a 28 y.o. female who presents to the Emergency Department complaining of right wrist pain that started approximately one week ago. Pt states she has associated finger pain and now the wrist is starting to swell. She states that any kind of movement makes the pain worse. Pt reports taking Aleve for the pain with mild relief. She denies any numbness or tingling of the wrist or the fingers. She denies any injury or trauma of the area.   Past Medical History  Diagnosis Date  . Kidney stones    Past Surgical History  Procedure Laterality Date  . Tubal ligation     No family history on file. History  Substance Use Topics  . Smoking status: Never Smoker   . Smokeless tobacco: Not on file  . Alcohol Use: No   OB History   Grav Para Term Preterm Abortions TAB SAB Ect Mult Living                 Review of Systems  Musculoskeletal: Positive for arthralgias (right wrist) and joint swelling (right wrist).  Neurological: Negative for numbness.  All other systems reviewed and are negative.   Allergies  Review of patient's allergies indicates no known allergies.  Home Medications   Prior to Admission medications   Medication Sig Start Date End Date Taking? Authorizing Provider  cyclobenzaprine (FLEXERIL) 5 MG tablet Take 1 tablet (5 mg total) by mouth 2 (two) times daily as needed for muscle spasms. 08/28/12   Audrey FilterGail K Schulz, NP  HYDROcodone-acetaminophen (NORCO/VICODIN) 5-325 MG per tablet Take 1 tablet by mouth every 6 (six) hours as  needed for pain. 08/28/12   Audrey FilterGail K Schulz, NP  naproxen sodium (ANAPROX) 220 MG tablet Take 220 mg by mouth 2 (two) times daily with a meal.    Historical Provider, MD   Triage Vitals: BP 110/78  Pulse 66  Temp(Src) 98.8 F (37.1 C) (Oral)  Resp 18  SpO2 100%  LMP 12/03/2013 Physical Exam  Nursing note and vitals reviewed. Constitutional: She is oriented to person, place, and time. She appears well-developed and well-nourished.  HENT:  Head: Normocephalic and atraumatic.  Eyes: EOM are normal.  Neck: Normal range of motion.  Cardiovascular: Normal rate.   Pulmonary/Chest: Effort normal.  Musculoskeletal: Normal range of motion. She exhibits edema and tenderness.  Right wrist mildly tender to palpation. No redness or warmth.   Neurological: She is alert and oriented to person, place, and time.  Skin: Skin is warm and dry. No erythema.  Psychiatric: She has a normal mood and affect. Her behavior is normal.    ED Course  Procedures (including critical care time) DIAGNOSTIC STUDIES: Oxygen Saturation is 100% on RA, normal by my interpretation.   COORDINATION OF CARE: 1:50 PM- Will X-Ray right wrist. Pt verbalizes understanding and agrees to plan.  Medications - No data to display  Labs Review Labs Reviewed - No data to display  Imaging Review Dg Wrist Complete Right  12/07/2013   CLINICAL DATA:  One week history of bilateral wrist pain  EXAM: RIGHT WRIST - COMPLETE 3+ VIEW  COMPARISON:  None.  FINDINGS: There is no evidence of fracture or dislocation. There is no evidence of arthropathy or other focal bone abnormality. Soft tissues are unremarkable.  IMPRESSION: Negative.   Electronically Signed   By: Malachy Moan M.D.   On: 12/07/2013 14:04     EKG Interpretation None      MDM   Final diagnoses:  Tendonitis    No bony abnormality noted. Splinted for comfort. Script for ultram and follow up with ortho  I personally performed the services described in this  documentation, which was scribed in my presence. The recorded information has been reviewed and is accurate.    Audrey Lower, NP 12/07/13 1409

## 2013-12-07 NOTE — ED Provider Notes (Signed)
Medical screening examination/treatment/procedure(s) were performed by non-physician practitioner and as supervising physician I was immediately available for consultation/collaboration.   EKG Interpretation None       Ethelda Chick, MD 12/07/13 [phone]

## 2013-12-07 NOTE — ED Notes (Signed)
Pt presents with c/o right wrist pain that started last weekend. Pt unsure of any injury that she is aware of to that area. Pt does have some mild swelling to that area. Pt does have pain with movement.

## 2013-12-07 NOTE — Discharge Instructions (Signed)
Tendinitis °Tendinitis is swelling and inflammation of the tendons. Tendons are band-like tissues that connect muscle to bone. Tendinitis commonly occurs in the:  °· Shoulders (rotator cuff). °· Heels (Achilles tendon). °· Elbows (triceps tendon). °CAUSES °Tendinitis is usually caused by overusing the tendon, muscles, and joints involved. When the tissue surrounding a tendon (synovium) becomes inflamed, it is called tenosynovitis. Tendinitis commonly develops in people whose jobs require repetitive motions. °SYMPTOMS °· Pain. °· Tenderness. °· Mild swelling. °DIAGNOSIS °Tendinitis is usually diagnosed by physical exam. Your caregiver may also order X-rays or other imaging tests. °TREATMENT °Your caregiver may recommend certain medicines or exercises for your treatment. °HOME CARE INSTRUCTIONS  °· Use a sling or splint for as long as directed by your caregiver until the pain decreases. °· Put ice on the injured area. °· Put ice in a plastic bag. °· Place a towel between your skin and the bag. °· Leave the ice on for 15-20 minutes, 03-04 times a day. °· Avoid using the limb while the tendon is painful. Perform gentle range of motion exercises only as directed by your caregiver. Stop exercises if pain or discomfort increase, unless directed otherwise by your caregiver. °· Only take over-the-counter or prescription medicines for pain, discomfort, or fever as directed by your caregiver. °SEEK MEDICAL CARE IF:  °· Your pain and swelling increase. °· You develop new, unexplained symptoms, especially increased numbness in the hands. °MAKE SURE YOU:  °· Understand these instructions. °· Will watch your condition. °· Will get help right away if you are not doing well or get worse. °Document Released: 07/01/2000 Document Revised: 09/26/2011 Document Reviewed: 09/20/2010 °[employer]® Patient Information ©2014 [employer]. ° °

## 2015-02-04 ENCOUNTER — Encounter (HOSPITAL_COMMUNITY): Payer: Self-pay | Admitting: Emergency Medicine

## 2015-02-04 ENCOUNTER — Emergency Department (HOSPITAL_COMMUNITY): Payer: Self-pay

## 2015-02-04 ENCOUNTER — Emergency Department (HOSPITAL_COMMUNITY)
Admission: EM | Admit: 2015-02-04 | Discharge: 2015-02-04 | Disposition: A | Discharge status: Home / Self Care | Payer: Self-pay | Attending: Emergency Medicine | Admitting: Emergency Medicine

## 2015-02-04 DIAGNOSIS — Z3202 Encounter for pregnancy test, result negative: Secondary | ICD-10-CM | POA: Insufficient documentation

## 2015-02-04 DIAGNOSIS — N390 Urinary tract infection, site not specified: Secondary | ICD-10-CM | POA: Insufficient documentation

## 2015-02-04 DIAGNOSIS — Z87442 Personal history of urinary calculi: Secondary | ICD-10-CM | POA: Insufficient documentation

## 2015-02-04 LAB — URINALYSIS, ROUTINE W REFLEX MICROSCOPIC
Bilirubin Urine: NEGATIVE
Glucose, UA: NEGATIVE mg/dL
Ketones, ur: NEGATIVE mg/dL
NITRITE: POSITIVE — AB
PROTEIN: NEGATIVE mg/dL
Specific Gravity, Urine: 1.016 (ref 1.005–1.030)
Urobilinogen, UA: 1 mg/dL (ref 0.0–1.0)
pH: 7.5 (ref 5.0–8.0)

## 2015-02-04 LAB — CBC WITH DIFFERENTIAL/PLATELET
BASOS PCT: 0 % (ref 0–1)
Basophils Absolute: 0 10*3/uL (ref 0.0–0.1)
Eosinophils Absolute: 0.1 10*3/uL (ref 0.0–0.7)
Eosinophils Relative: 1 % (ref 0–5)
HEMATOCRIT: 40.5 % (ref 36.0–46.0)
Hemoglobin: 12.6 g/dL (ref 12.0–15.0)
LYMPHS ABS: 2.2 10*3/uL (ref 0.7–4.0)
LYMPHS PCT: 26 % (ref 12–46)
MCH: 25.9 pg — ABNORMAL LOW (ref 26.0–34.0)
MCHC: 31.1 g/dL (ref 30.0–36.0)
MCV: 83.2 fL (ref 78.0–100.0)
MONO ABS: 0.5 10*3/uL (ref 0.1–1.0)
Monocytes Relative: 6 % (ref 3–12)
Neutro Abs: 5.5 10*3/uL (ref 1.7–7.7)
Neutrophils Relative %: 67 % (ref 43–77)
PLATELETS: 270 10*3/uL (ref 150–400)
RBC: 4.87 MIL/uL (ref 3.87–5.11)
RDW: 15.5 % (ref 11.5–15.5)
WBC: 8.3 10*3/uL (ref 4.0–10.5)

## 2015-02-04 LAB — BASIC METABOLIC PANEL
Anion gap: 3 — ABNORMAL LOW (ref 5–15)
BUN: 16 mg/dL (ref 6–20)
CO2: 25 mmol/L (ref 22–32)
Calcium: 8.1 mg/dL — ABNORMAL LOW (ref 8.9–10.3)
Chloride: 111 mmol/L (ref 101–111)
Creatinine, Ser: 0.83 mg/dL (ref 0.44–1.00)
GFR calc Af Amer: >60 mL/min (ref >60)
GFR calc non Af Amer: >60 mL/min (ref >60)
Glucose, Bld: 102 mg/dL — ABNORMAL HIGH (ref 65–99)
Potassium: 4.3 mmol/L (ref 3.5–5.1)
Sodium: 139 mmol/L (ref 135–145)

## 2015-02-04 LAB — POC URINE PREG, ED: Preg Test, Ur: NEGATIVE

## 2015-02-04 LAB — URINE MICROSCOPIC-ADD ON

## 2015-02-04 MED ORDER — KETOROLAC TROMETHAMINE 30 MG/ML IJ SOLN
30.0000 mg | Freq: Once | INTRAMUSCULAR | Status: AC
  Administered 2015-02-04: 30 mg via INTRAVENOUS
  Filled 2015-02-04: qty 1

## 2015-02-04 MED ORDER — SODIUM CHLORIDE 0.9 % IV BOLUS (SEPSIS)
1000.0000 mL | Freq: Once | INTRAVENOUS | Status: AC
  Administered 2015-02-04: 1000 mL via INTRAVENOUS

## 2015-02-04 MED ORDER — CEPHALEXIN 500 MG PO CAPS: 500.0000 mg | ORAL_CAPSULE | Freq: Four times a day (QID) | ORAL | Status: DC

## 2015-02-04 MED ORDER — CEPHALEXIN 500 MG PO CAPS
500.0000 mg | ORAL_CAPSULE | Freq: Once | ORAL | Status: AC
  Administered 2015-02-04: 500 mg via ORAL
  Filled 2015-02-04: qty 1

## 2015-02-04 MED ORDER — TRAMADOL HCL 50 MG PO TABS: 50.0000 mg | ORAL_TABLET | Freq: Four times a day (QID) | ORAL | Status: DC | PRN

## 2015-02-04 MED ORDER — MORPHINE SULFATE 4 MG/ML IJ SOLN
4.0000 mg | Freq: Once | INTRAMUSCULAR | Status: AC
  Administered 2015-02-04: 4 mg via INTRAVENOUS
  Filled 2015-02-04: qty 1

## 2015-02-04 MED ORDER — ONDANSETRON HCL 4 MG/2ML IJ SOLN
4.0000 mg | Freq: Once | INTRAMUSCULAR | Status: AC
  Administered 2015-02-04: 4 mg via INTRAVENOUS
  Filled 2015-02-04: qty 2

## 2015-02-04 NOTE — ED Notes (Signed)
Pt reports left flank pain , Hx kidney stone. Also reports nausea and emesis x 1. Denies urinary symptoms. No pain radiation.

## 2015-02-04 NOTE — ED Notes (Signed)
Ultrasound at bedside

## 2015-02-04 NOTE — Discharge Instructions (Signed)
Urinary Tract Infection Plan fluids. Take anabiotic says prescribed. Follow-up with urology. A urinary tract infection (UTI) can occur any place along the urinary tract. The tract includes the kidneys, ureters, bladder, and urethra. A type of germ called bacteria often causes a UTI. UTIs are often helped with antibiotic medicine.  HOME CARE   If given, take antibiotics as told by your doctor. Finish them even if you start to feel better.  Drink enough fluids to keep your pee (urine) clear or pale yellow.  Avoid tea, drinks with caffeine, and bubbly (carbonated) drinks.  Pee often. Avoid holding your pee in for a long time.  Pee before and after having sex (intercourse).  Wipe from front to back after you poop (bowel movement) if you are a woman. Use each tissue only once. GET HELP RIGHT AWAY IF:   You have back pain.  You have lower belly (abdominal) pain.  You have chills.  You feel sick to your stomach (nauseous).  You throw up (vomit).  Your burning or discomfort with peeing does not go away.  You have a fever.  Your symptoms are not better in 3 days. MAKE SURE YOU:   Understand these instructions.  Will watch your condition.  Will get help right away if you are not doing well or get worse. Document Released: 12/21/2007 Document Revised: 03/28/2012 Document Reviewed: 02/02/2012 [facility] Patient Information 2015 [place], [place]. This information is not intended to replace advice given to you by your health care provider. Make sure you discuss any questions you have with your health care provider.

## 2015-02-04 NOTE — ED Provider Notes (Signed)
CSN: [id number]     Arrival date & time 02/04/15  0722 History   First MD Initiated Contact with Patient 02/04/15 [phone]     Chief Complaint  Patient presents with  . Flank Pain    left     (Consider location/radiation/quality/duration/timing/severity/associated sxs/prior Treatment) Patient is a 29 y.o. female presenting with flank pain. The history is provided by the patient. No language interpreter was used.  Flank Pain Associated symptoms include nausea and vomiting. Pertinent negatives include no abdominal pain or fever.   Miss Oswaldo Done is a 29 year old female with a history of kidney stones who presents for right-sided back pain and 2 episodes of vomiting since last night. She states her last kidney stone was 5 years ago and feels similar to this. Nothing makes it better or worse. No treatment prior to arrival. She denies any fever, chills, chest pain, shortness of breath, cough, abdominal pain, diarrhea, hematuria, dysuria, urinary frequency, vaginal discharge. She is currently on her menstrual cycle.  Past Medical History  Diagnosis Date  . Kidney stones    Past Surgical History  Procedure Laterality Date  . Tubal ligation     No family history on file. History  Substance Use Topics  . Smoking status: Never Smoker   . Smokeless tobacco: Not on file  . Alcohol Use: No   OB History    No data available     Review of Systems  Constitutional: Negative for fever.  Gastrointestinal: Positive for nausea and vomiting. Negative for abdominal pain.  Genitourinary: Positive for flank pain.  Musculoskeletal: Positive for back pain.  All other systems reviewed and are negative.     Allergies  Review of patient's allergies indicates no known allergies.  Home Medications   Prior to Admission medications   Medication Sig Start Date End Date Taking? Authorizing Provider  cephALEXin (KEFLEX) 500 MG capsule Take 1 capsule (500 mg total) by mouth 4 (four) times daily. 02/04/15    Carolynn Tuley Patel-Mills, PA-C  cyclobenzaprine (FLEXERIL) 5 MG tablet Take 1 tablet (5 mg total) by mouth 2 (two) times daily as needed for muscle spasms. Patient not taking: Reported on 02/04/2015 08/28/12   Earley Favor, NP  HYDROcodone-acetaminophen (NORCO/VICODIN) 5-325 MG per tablet Take 1 tablet by mouth every 6 (six) hours as needed for pain. Patient not taking: Reported on 02/04/2015 08/28/12   Earley Favor, NP  traMADol (ULTRAM) 50 MG tablet Take 1 tablet (50 mg total) by mouth every 6 (six) hours as needed. 02/04/15   Theola Cuellar Patel-Mills, PA-C   BP 100/57 mmHg  Pulse 73  Temp(Src) 98.5 F (36.9 C) (Oral)  Resp 20  SpO2 100%  LMP 02/01/2015 Physical Exam  Constitutional: She is oriented to person, place, and time. She appears well-developed and well-nourished.  HENT:  Head: Normocephalic and atraumatic.  Eyes: Conjunctivae are normal.  Neck: Normal range of motion. Neck supple.  Cardiovascular: Normal rate, regular rhythm and normal heart sounds.   Pulmonary/Chest: Breath sounds normal. No respiratory distress. She has no wheezes. She has no rales.  Abdominal: Soft. She exhibits no distension and no mass. There is no tenderness. There is CVA tenderness. There is no rigidity, no rebound, no guarding, no tenderness at McBurney's point and negative Murphy's sign.  Right CVA tenderness.  Musculoskeletal: Normal range of motion.  Neurological: She is alert and oriented to person, place, and time.  Skin: Skin is warm and dry.  Nursing note and vitals reviewed.   ED Course  Procedures (including critical care  time) Labs Review Labs Reviewed  CBC WITH DIFFERENTIAL/PLATELET - Abnormal; Notable for the following:    MCH 25.9 (*)    All other components within normal limits  URINALYSIS, ROUTINE W REFLEX MICROSCOPIC (NOT [facility]) - Abnormal; Notable for the following:    APPearance CLOUDY (*)    Hgb urine dipstick MODERATE (*)    Nitrite POSITIVE (*)    Leukocytes, UA LARGE (*)    All other  components within normal limits  BASIC METABOLIC PANEL - Abnormal; Notable for the following:    Glucose, Bld 102 (*)    Calcium 8.1 (*)    Anion gap 3 (*)    All other components within normal limits  URINE MICROSCOPIC-ADD ON - Abnormal; Notable for the following:    Bacteria, UA MANY (*)    All other components within normal limits  POC URINE PREG, ED    Imaging Review [facility]  02/04/2015   CLINICAL DATA:  Acute onset right flank pain  EXAM: RENAL / URINARY TRACT ULTRASOUND COMPLETE  COMPARISON:  January 16, 2009  FINDINGS: Right Kidney:  Length: 11.4 cm. Echogenicity and renal cortical thickness are within normal limits. No mass, perinephric fluid, or hydronephrosis visualized. There is a 1.1 cm calculus with a nearby 1.5 cm calculus in the lower pole of the right kidney. No ureterectasis is appreciable.  Left Kidney:  Length: 10.1 cm. Echogenicity and renal cortical thickness are within normal limits. No mass, perinephric fluid, or hydronephrosis visualized. No sonographically demonstrable calculus or ureterectasis.  Bladder:  Appears normal for degree of bladder distention.  IMPRESSION: Prominent nonobstructing calculi in the lower pole the right kidney. Study otherwise unremarkable.   Electronically Signed   By: Bretta BangWilliam  Woodruff III M.D.   On: 02/04/2015 09:02     EKG Interpretation None      MDM   Final diagnoses:  Urinary tract infection, acute  Patient presents for right CVA tenderness. Her vitals are stable and labs, including kidney function are unremarkable. Ultrasound shows non obstructing calculi in lower pole of the right kidney. She has a UTI. I treated her with keflex and gave her urology follow up as well as ultram for pain. I also gave the patient return precautions. Patient verbally agrees with the plan. Medications  ketorolac (TORADOL) 30 MG/ML injection 30 mg (30 mg Intravenous Given 02/04/15 0804)  ondansetron (ZOFRAN) injection 4 mg (4 mg Intravenous Given 02/04/15  0804)  sodium chloride 0.9 % bolus 1,000 mL (0 mLs Intravenous Stopped 02/04/15 0907)  morphine 4 MG/ML injection 4 mg (4 mg Intravenous Given 02/04/15 1015)  cephALEXin (KEFLEX) capsule 500 mg (500 mg Oral Given 02/04/15 1015)       Catha GosselinHanna Patel-Mills, PA-C 02/04/15 1606  Raeford RazorStephen Kohut, MD 02/05/15 1424

## 2015-06-03 ENCOUNTER — Emergency Department (HOSPITAL_COMMUNITY): Payer: Self-pay

## 2015-06-03 ENCOUNTER — Observation Stay (HOSPITAL_COMMUNITY)
Admission: EM | Admit: 2015-06-03 | Discharge: 2015-06-04 | Disposition: A | Discharge status: Home / Self Care | Payer: Self-pay | Attending: Urology | Admitting: Urology

## 2015-06-03 ENCOUNTER — Encounter (HOSPITAL_COMMUNITY): Payer: Self-pay

## 2015-06-03 DIAGNOSIS — N201 Calculus of ureter: Secondary | ICD-10-CM | POA: Diagnosis present

## 2015-06-03 DIAGNOSIS — N390 Urinary tract infection, site not specified: Secondary | ICD-10-CM | POA: Insufficient documentation

## 2015-06-03 DIAGNOSIS — Z79899 Other long term (current) drug therapy: Secondary | ICD-10-CM | POA: Insufficient documentation

## 2015-06-03 DIAGNOSIS — R109 Unspecified abdominal pain: Secondary | ICD-10-CM

## 2015-06-03 DIAGNOSIS — Z791 Long term (current) use of non-steroidal anti-inflammatories (NSAID): Secondary | ICD-10-CM | POA: Insufficient documentation

## 2015-06-03 DIAGNOSIS — N132 Hydronephrosis with renal and ureteral calculous obstruction: Principal | ICD-10-CM | POA: Insufficient documentation

## 2015-06-03 DIAGNOSIS — R110 Nausea: Secondary | ICD-10-CM

## 2015-06-03 DIAGNOSIS — N200 Calculus of kidney: Secondary | ICD-10-CM

## 2015-06-03 DIAGNOSIS — Z87442 Personal history of urinary calculi: Secondary | ICD-10-CM | POA: Insufficient documentation

## 2015-06-03 LAB — URINALYSIS, ROUTINE W REFLEX MICROSCOPIC
Bilirubin Urine: NEGATIVE
GLUCOSE, UA: NEGATIVE mg/dL
Ketones, ur: NEGATIVE mg/dL
Nitrite: NEGATIVE
PROTEIN: NEGATIVE mg/dL
Specific Gravity, Urine: 1.014 (ref 1.005–1.030)
pH: 8 (ref 5.0–8.0)

## 2015-06-03 LAB — CBC WITH DIFFERENTIAL/PLATELET
BASOS ABS: 0 10*3/uL (ref 0.0–0.1)
BASOS PCT: 0 %
Eosinophils Absolute: 0 10*3/uL (ref 0.0–0.7)
Eosinophils Relative: 0 %
HEMATOCRIT: 38.5 % (ref 36.0–46.0)
Hemoglobin: 12.4 g/dL (ref 12.0–15.0)
Lymphocytes Relative: 16 %
Lymphs Abs: 1.4 10*3/uL (ref 0.7–4.0)
MCH: 26.8 pg (ref 26.0–34.0)
MCHC: 32.2 g/dL (ref 30.0–36.0)
MCV: 83.2 fL (ref 78.0–100.0)
MONOS PCT: 8 %
Monocytes Absolute: 0.7 10*3/uL (ref 0.1–1.0)
NEUTROS ABS: 6.5 10*3/uL (ref 1.7–7.7)
NEUTROS PCT: 76 %
Platelets: 237 10*3/uL (ref 150–400)
RBC: 4.63 MIL/uL (ref 3.87–5.11)
RDW: 14.5 % (ref 11.5–15.5)
WBC: 8.7 10*3/uL (ref 4.0–10.5)

## 2015-06-03 LAB — POC URINE PREG, ED: Preg Test, Ur: NEGATIVE

## 2015-06-03 LAB — URINE MICROSCOPIC-ADD ON

## 2015-06-03 LAB — COMPREHENSIVE METABOLIC PANEL
ALBUMIN: 3.9 g/dL (ref 3.5–5.0)
ALT: 13 U/L — ABNORMAL LOW (ref 14–54)
AST: 17 U/L (ref 15–41)
Alkaline Phosphatase: 55 U/L (ref 38–126)
Anion gap: 7 (ref 5–15)
BILIRUBIN TOTAL: 0.6 mg/dL (ref 0.3–1.2)
BUN: 13 mg/dL (ref 6–20)
CO2: 26 mmol/L (ref 22–32)
Calcium: 9 mg/dL (ref 8.9–10.3)
Chloride: 106 mmol/L (ref 101–111)
Creatinine, Ser: 0.85 mg/dL (ref 0.44–1.00)
GFR calc Af Amer: >60 mL/min (ref >60)
GFR calc non Af Amer: >60 mL/min (ref >60)
GLUCOSE: 89 mg/dL (ref 65–99)
Potassium: 3.8 mmol/L (ref 3.5–5.1)
Sodium: 139 mmol/L (ref 135–145)
Total Protein: 7.7 g/dL (ref 6.5–8.1)

## 2015-06-03 LAB — LIPASE, BLOOD: Lipase: 23 U/L (ref 11–51)

## 2015-06-03 MED ORDER — ONDANSETRON HCL 4 MG/2ML IJ SOLN
4.0000 mg | Freq: Once | INTRAMUSCULAR | Status: AC
  Administered 2015-06-03: 4 mg via INTRAVENOUS
  Filled 2015-06-03: qty 2

## 2015-06-03 MED ORDER — KETOROLAC TROMETHAMINE 30 MG/ML IJ SOLN
30.0000 mg | Freq: Once | INTRAMUSCULAR | Status: AC
  Administered 2015-06-03: 30 mg via INTRAVENOUS
  Filled 2015-06-03: qty 1

## 2015-06-03 MED ORDER — DEXTROSE 5 % IV SOLN
1.0000 g | Freq: Once | INTRAVENOUS | Status: AC
  Administered 2015-06-03: 1 g via INTRAVENOUS
  Filled 2015-06-03: qty 10

## 2015-06-03 MED ORDER — SODIUM CHLORIDE 0.9 % IV BOLUS (SEPSIS)
1000.0000 mL | Freq: Once | INTRAVENOUS | Status: AC
  Administered 2015-06-03: 1000 mL via INTRAVENOUS

## 2015-06-03 MED ORDER — MORPHINE SULFATE (PF) 4 MG/ML IV SOLN
4.0000 mg | Freq: Once | INTRAVENOUS | Status: AC
  Administered 2015-06-03: 4 mg via INTRAVENOUS
  Filled 2015-06-03: qty 1

## 2015-06-03 NOTE — ED Notes (Signed)
Pt c/o R flank pain starting last night and nausea starting this morning.  Pain score 9/10.  Pt reports taking ibuprofen last night w/ "a little relief."  Sts abdominal cramping last night which has resolved.  Hx of kidney stones.

## 2015-06-03 NOTE — H&P (Signed)
H&P  Chief Complaint: Right flank pain, nausea and vomiting  History of Present Illness: Audrey Gonzalez is a 29 y.o. year old female who apparently has a prior history of urolithiasis. She's been having intermittent right-sided back pain for a week or 2, but this became more severe yesterday. It was accompanied by nausea and vomiting. She denies fever or chills. She denies left-sided pain. She has not had recent urinary tract infection, but was treated for urinary tract infection during an admission to the emergency room in July of this year. From what I can see, a culture was not done. She was found to have a kidney stone in the right kidney at that time based on ultrasound.  She presented to the emergency room where CT was performed due to her history of stones. There was a 9 x 5 mm right distal ureteral stone. Additionally, there were very large stones in the lower pole of right kidney and tiny stones in the left. There was obvious right hydroureteronephrosis.  Because of the patient's right distal ureteral stone and large stone burden on the right, I suggested that we admit her to the hospital for possible ureteroscopy, laser lithotripsy of her ureteralo stone as well as stone extraction tomorrow.  Past Medical History  Diagnosis Date  . Kidney stones     Past Surgical History  Procedure Laterality Date  . Tubal ligation      Home Medications:   (Not in a hospital admission)  Allergies: No Known Allergies  History reviewed. No pertinent family history.  Social History:  reports that she has never smoked. She does not have any smokeless tobacco history on file. She reports that she does not drink alcohol or use illicit drugs.  ROS: A complete review of systems was performed.  All systems are negative except for pertinent findings as noted.  Physical Exam:  Vital signs in last 24 hours: Temp:  [98.8 F (37.1 C)] 98.8 F (37.1 C) (11/16 1613) Pulse Rate:  [75-92] 75 (11/16  2026) Resp:  [14-20] 14 (11/16 2026) BP: (104-106)/(67-70) 104/70 mmHg (11/16 2026) SpO2:  [99 %-100 %] 99 % (11/16 2026) General:  Alert and oriented, mild distress HEENT: Normocephalic, atraumatic Neck: No JVD or lymphadenopathy Cardiovascular: Regular rate and rhythm Lungs: Clear bilaterally Abdomen: Soft, nontender, nondistended, no abdominal masses Back: Right CVA tenderness noted. Extremities: No edema Neurologic: Grossly intact  Laboratory Data:  Results for orders placed or performed during the hospital encounter of 06/03/15 (from the past 24 hour(s))  Urinalysis, Routine w reflex microscopic-may I&O cath if menses (not at [facility])     Status: Abnormal   Collection Time: 06/03/15  5:36 PM  Result Value Ref Range   Color, Urine YELLOW YELLOW   APPearance CLOUDY (A) CLEAR   Specific Gravity, Urine 1.014 1.005 - 1.030   pH 8.0 5.0 - 8.0   Glucose, UA NEGATIVE NEGATIVE mg/dL   Hgb urine dipstick SMALL (A) NEGATIVE   Bilirubin Urine NEGATIVE NEGATIVE   Ketones, ur NEGATIVE NEGATIVE mg/dL   Protein, ur NEGATIVE NEGATIVE mg/dL   Nitrite NEGATIVE NEGATIVE   Leukocytes, UA LARGE (A) NEGATIVE  Urine microscopic-add on     Status: Abnormal   Collection Time: 06/03/15  5:36 PM  Result Value Ref Range   Squamous Epithelial / LPF 0-5 (A) NONE SEEN   WBC, UA TOO NUMEROUS TO COUNT 0 - 5 WBC/hpf   RBC / HPF 0-5 0 - 5 RBC/hpf   Bacteria, UA MANY (A) NONE  SEEN   Urine-Other MUCOUS PRESENT   POC urine preg, ED (not at [facility])     Status: None   Collection Time: 06/03/15  5:41 PM  Result Value Ref Range   Preg Test, Ur NEGATIVE NEGATIVE  CBC with Differential     Status: None   Collection Time: 06/03/15  6:17 PM  Result Value Ref Range   WBC 8.7 4.0 - 10.5 K/uL   RBC 4.63 3.87 - 5.11 MIL/uL   Hemoglobin 12.4 12.0 - 15.0 g/dL   HCT 16.1 09.6 - 04.5 %   MCV 83.2 78.0 - 100.0 fL   MCH 26.8 26.0 - 34.0 pg   MCHC 32.2 30.0 - 36.0 g/dL   RDW 40.9 81.1 - 91.4 %   Platelets 237 150 - 400  K/uL   Neutrophils Relative % 76 %   Neutro Abs 6.5 1.7 - 7.7 K/uL   Lymphocytes Relative 16 %   Lymphs Abs 1.4 0.7 - 4.0 K/uL   Monocytes Relative 8 %   Monocytes Absolute 0.7 0.1 - 1.0 K/uL   Eosinophils Relative 0 %   Eosinophils Absolute 0.0 0.0 - 0.7 K/uL   Basophils Relative 0 %   Basophils Absolute 0.0 0.0 - 0.1 K/uL  Comprehensive metabolic panel     Status: Abnormal   Collection Time: 06/03/15  6:17 PM  Result Value Ref Range   Sodium 139 135 - 145 mmol/L   Potassium 3.8 3.5 - 5.1 mmol/L   Chloride 106 101 - 111 mmol/L   CO2 26 22 - 32 mmol/L   Glucose, Bld 89 65 - 99 mg/dL   BUN 13 6 - 20 mg/dL   Creatinine, Ser 7.82 0.44 - 1.00 mg/dL   Calcium 9.0 8.9 - 95.6 mg/dL   Total Protein 7.7 6.5 - 8.1 g/dL   Albumin 3.9 3.5 - 5.0 g/dL   AST 17 15 - 41 U/L   ALT 13 (L) 14 - 54 U/L   Alkaline Phosphatase 55 38 - 126 U/L   Total Bilirubin 0.6 0.3 - 1.2 mg/dL   GFR calc non Af Amer >60 >60 mL/min   GFR calc Af Amer >60 >60 mL/min   Anion gap 7 5 - 15  Lipase, blood     Status: None   Collection Time: 06/03/15  6:17 PM  Result Value Ref Range   Lipase 23 11 - 51 U/L   No results found for this or any previous visit (from the past 240 hour(s)). Creatinine:  Recent Labs  06/03/15 1817  CREATININE 0.85    Radiologic Imaging: Ct Renal Stone Study  06/03/2015  CLINICAL DATA:  Acute onset of right flank pain and increased urinary frequency. Initial encounter. EXAM: CT ABDOMEN AND PELVIS WITHOUT CONTRAST TECHNIQUE: Multidetector CT imaging of the abdomen and pelvis was performed following the standard protocol without IV contrast. COMPARISON:  Renal ultrasound performed 02/04/2015 FINDINGS: The visualized lung bases are clear. The liver and spleen are unremarkable in appearance. Mildly increased attenuation within the gallbladder may reflect sludge. The pancreas and adrenal glands are unremarkable. There is mild right-sided hydronephrosis, somewhat chronic in appearance. An  obstructing large 9 x 7 mm stone is noted distally just above the right vesicoureteral junction. Scattered bilateral medullary calcifications are seen, along with a large 2.4 cm stone at the lower pole of the right kidney. There is also a 1.6 cm parenchymal calcification at the lower pole of the right kidney. The left kidney is otherwise unremarkable. No perinephric stranding is  seen. No free fluid is identified. The small bowel is unremarkable in appearance. The stomach is within normal limits. No acute vascular abnormalities are seen. The appendix is normal in caliber and contains air, without evidence of appendicitis. The colon is grossly unremarkable in appearance. The bladder is mildly distended and grossly unremarkable. The uterus is within normal limits. The ovaries are grossly symmetric. Two left-sided tubal ligation clips are noted, though positioning may simply be transient in nature. No inguinal lymphadenopathy is seen. No acute osseous abnormalities are identified. IMPRESSION: 1. Mild right-sided hydronephrosis, somewhat chronic in appearance, suggesting recurrent obstruction. Obstructing large 9 x 7 mm stone noted distally just above the right vesicoureteral junction. 2. Scattered bilateral medullary calcifications noted, along with a large 2.4 cm stone at the lower pole of the right kidney. 1.6 cm parenchymal calcification also noted at the lower pole of the right kidney. 3. Mildly increased attenuation within the gallbladder may reflect sludge. Gallbladder otherwise unremarkable in appearance. Electronically Signed   By: Roanna RaiderJeffery  Chang M.D.   On: 06/03/2015 18:57   I reviewed the patient's CT stone, revealing a 25 mm stone in the lower pole of the right kidney which is adjacent to a 15 mm stone. That stone approaches the margin of the lower pole. She has pyuria. She does not have leukocytosis. Impression/Assessment:  1. Obstructing, symptomatic 5 x 9 mm right ureteral stone, located distally, with  associated right hydroureteronephrosis.  2. Large stone burden in lower pole of right kidney-this will eventually need percutaneous management  3. Possible UTI.  Plan:  1. I will admit the patient for observation. I will see if we can put her on tomorrow for cystoscopy, right retrograde, right ureteroscopy, holmium laser lithotripsy and extraction of her right ureteral stone as well as possible right double-J stent.  2. I have discussed the above procedure with the patient, risks and complications. She desires to proceed  3. At a later time, we will consider percutaneous management of her large right renal stone burden.  Chelsea AusDAHLSTEDT, Narcissa Melder M 06/03/2015, 9:53 PM  Bertram MillardStephen M. Naziyah Tieszen MD

## 2015-06-03 NOTE — ED Notes (Signed)
MD at bedside - Dr. Hillis Rangeahlstadt.

## 2015-06-03 NOTE — ED Provider Notes (Signed)
CSN: [id number]     Arrival date & time 06/03/15  1558 History   First MD Initiated Contact with Patient 06/03/15 1756     Chief Complaint  Patient presents with  . Flank Pain     (Consider location/radiation/quality/duration/timing/severity/associated sxs/prior Treatment) HPI Comments: Audrey Gonzalez is a 29 y.o. female with a PMHx of nephrolithiasis, who presents to the ED with complaints of right flank pain that began around 9 PM last night, and worsened this morning. She describes the pain is 9/10 constant sharp nonradiating pain worse with walking or movement, and mildly improved with ibuprofen. Associated symptoms include nausea and increased urinary frequency. She states this feels like her prior kidney stones.  She denies any fevers, chills, chest pain, shortness breath, abdominal pain, vomiting, diarrhea, constipation, melena, hematochezia, obstipation, dysuria, hematuria, vaginal bleeding or discharge, numbness, tingling, weakness, recent travel, suspicious food intake, sick contacts, NSAID use, or alcohol use. LMP 05/20/15  Patient is a 29 y.o. female presenting with flank pain. The history is provided by the patient. No language interpreter was used.  Flank Pain This is a recurrent problem. The current episode started yesterday. The problem occurs constantly. The problem has been unchanged. Associated symptoms include nausea and urinary symptoms. Pertinent negatives include no abdominal pain, arthralgias, chest pain, chills, fever, myalgias, numbness, vomiting or weakness. The symptoms are aggravated by walking. She has tried NSAIDs for the symptoms. The treatment provided mild relief.    Past Medical History  Diagnosis Date  . Kidney stones    Past Surgical History  Procedure Laterality Date  . Tubal ligation     History reviewed. No pertinent family history. Social History  Substance Use Topics  . Smoking status: Never Smoker   . Smokeless tobacco: None  . Alcohol Use:  No   OB History    No data available     Review of Systems  Constitutional: Negative for fever and chills.  Respiratory: Negative for shortness of breath.   Cardiovascular: Negative for chest pain.  Gastrointestinal: Positive for nausea. Negative for vomiting, abdominal pain, diarrhea, constipation and blood in stool.  Genitourinary: Positive for frequency and flank pain. Negative for dysuria, hematuria, vaginal bleeding and vaginal discharge.  Musculoskeletal: Negative for myalgias and arthralgias.  Skin: Negative for color change.  Allergic/Immunologic: Negative for immunocompromised state.  Neurological: Negative for weakness and numbness.  Psychiatric/Behavioral: Negative for confusion.   10 Systems reviewed and are negative for acute change except as noted in the HPI.    Allergies  Review of patient's allergies indicates no known allergies.  Home Medications   Prior to Admission medications   Medication Sig Start Date End Date Taking? Authorizing Provider  ibuprofen (ADVIL,MOTRIN) 200 MG tablet Take 200 mg by mouth every 6 (six) hours as needed for moderate pain.   Yes Historical Provider, MD  cephALEXin (KEFLEX) 500 MG capsule Take 1 capsule (500 mg total) by mouth 4 (four) times daily. Patient not taking: Reported on 06/03/2015 02/04/15   Catha Gosselin, PA-C  cyclobenzaprine (FLEXERIL) 5 MG tablet Take 1 tablet (5 mg total) by mouth 2 (two) times daily as needed for muscle spasms. Patient not taking: Reported on 02/04/2015 08/28/12   Earley Favor, NP  HYDROcodone-acetaminophen (NORCO/VICODIN) 5-325 MG per tablet Take 1 tablet by mouth every 6 (six) hours as needed for pain. Patient not taking: Reported on 02/04/2015 08/28/12   Earley Favor, NP  traMADol (ULTRAM) 50 MG tablet Take 1 tablet (50 mg total) by mouth every 6 (six)  hours as needed. Patient not taking: Reported on 06/03/2015 02/04/15   Hanna Patel-Mills, PA-C   BP 106/67 mmHg  Pulse 92  Temp(Src) 98.8 F (37.1 C)  (Oral)  Resp 20  SpO2 100%  LMP 05/20/2015 Physical Exam  Constitutional: She is oriented to person, place, and time. Vital signs are normal. She appears well-developed and well-nourished.  Non-toxic appearance. No distress.  Afebrile, nontoxic, NAD  HENT:  Head: Normocephalic and atraumatic.  Mouth/Throat: Oropharynx is clear and moist and mucous membranes are normal.  Eyes: Conjunctivae and EOM are normal. Right eye exhibits no discharge. Left eye exhibits no discharge.  Neck: Normal range of motion. Neck supple.  Cardiovascular: Normal rate, regular rhythm, normal heart sounds and intact distal pulses.  Exam reveals no gallop and no friction rub.   No murmur heard. Pulmonary/Chest: Effort normal and breath sounds normal. No respiratory distress. She has no decreased breath sounds. She has no wheezes. She has no rhonchi. She has no rales.  Abdominal: Soft. Normal appearance and bowel sounds are normal. She exhibits no distension. There is no tenderness. There is CVA tenderness. There is no rigidity, no rebound, no guarding, no tenderness at McBurney's point and negative Murphy's sign.  Soft, NTND, +BS throughout, no r/g/r, neg murphy's, neg mcburney's, +R sided CVA TTP   Musculoskeletal: Normal range of motion.  Neurological: She is alert and oriented to person, place, and time. She has normal strength. No sensory deficit.  Skin: Skin is warm, dry and intact. No rash noted.  Psychiatric: She has a normal mood and affect.  Nursing note and vitals reviewed.   ED Course  Procedures (including critical care time) Labs Review Labs Reviewed  URINALYSIS, ROUTINE W REFLEX MICROSCOPIC (NOT AT [facility]) - Abnormal; Notable for the following:    APPearance CLOUDY (*)    Hgb urine dipstick SMALL (*)    Leukocytes, UA LARGE (*)    All other components within normal limits  COMPREHENSIVE METABOLIC PANEL - Abnormal; Notable for the following:    ALT 13 (*)    All other components within normal  limits  URINE MICROSCOPIC-ADD ON - Abnormal; Notable for the following:    Squamous Epithelial / LPF 0-5 (*)    Bacteria, UA MANY (*)    All other components within normal limits  CBC WITH DIFFERENTIAL/PLATELET  LIPASE, BLOOD  POC URINE PREG, ED    Imaging Review Ct Renal Stone Study  06/03/2015  CLINICAL DATA:  Acute onset of right flank pain and increased urinary frequency. Initial encounter. EXAM: CT ABDOMEN AND PELVIS WITHOUT CONTRAST TECHNIQUE: Multidetector CT imaging of the abdomen and pelvis was performed following the standard protocol without IV contrast. COMPARISON:  Renal ultrasound performed 02/04/2015 FINDINGS: The visualized lung bases are clear. The liver and spleen are unremarkable in appearance. Mildly increased attenuation within the gallbladder may reflect sludge. The pancreas and adrenal glands are unremarkable. There is mild right-sided hydronephrosis, somewhat chronic in appearance. An obstructing large 9 x 7 mm stone is noted distally just above the right vesicoureteral junction. Scattered bilateral medullary calcifications are seen, along with a large 2.4 cm stone at the lower pole of the right kidney. There is also a 1.6 cm parenchymal calcification at the lower pole of the right kidney. The left kidney is otherwise unremarkable. No perinephric stranding is seen. No free fluid is identified. The small bowel is unremarkable in appearance. The stomach is within normal limits. No acute vascular abnormalities are seen. The appendix is normal in caliber  and contains air, without evidence of appendicitis. The colon is grossly unremarkable in appearance. The bladder is mildly distended and grossly unremarkable. The uterus is within normal limits. The ovaries are grossly symmetric. Two left-sided tubal ligation clips are noted, though positioning may simply be transient in nature. No inguinal lymphadenopathy is seen. No acute osseous abnormalities are identified. IMPRESSION: 1. Mild  right-sided hydronephrosis, somewhat chronic in appearance, suggesting recurrent obstruction. Obstructing large 9 x 7 mm stone noted distally just above the right vesicoureteral junction. 2. Scattered bilateral medullary calcifications noted, along with a large 2.4 cm stone at the lower pole of the right kidney. 1.6 cm parenchymal calcification also noted at the lower pole of the right kidney. 3. Mildly increased attenuation within the gallbladder may reflect sludge. Gallbladder otherwise unremarkable in appearance. Electronically Signed   By: Roanna RaiderJeffery  Chang M.D.   On: 06/03/2015 18:57   I have personally reviewed and evaluated these images and lab results as part of my medical decision-making.   EKG Interpretation None      MDM   Final diagnoses:  Right flank pain  Nephrolithiasis  Nausea  UTI (lower urinary tract infection)    29 y.o. female here with right flank pain and nausea that initially began last night and worsened today. She states this feels similar to her prior kidney stones. Increased urinary frequency also reported. No abdominal tenderness, mild right flank tenderness. No pelvic complaints or tenderness, doubt need for pelvic exam. Will obtain lab work, urine, urine pregnancy, and CT renal study. Will give pain medicine, nausea medicine, fluids, and reassess shortly.  6:36 PM Upreg neg. U/A with small hgb, 0-5 RBC/hpf, large leuks, 0-5 squamous but TNTC WBC and many bacteria. Will treat as UTI/early pyelo, will give rocephin here. Awaiting labs and CT imaging. Will reassess shortly.   7:51 PM CBC w/diff unremarkable. CMP WNL. Lipase WNL. CT renal showing R hydronephrosis with obstructing 9x137mm stone in R UVJ as well as 2.4cm in R lower pole. Will consult urology. Pt has not eaten anything all day. Pain improved but not completely gone. Will await urology consult before proceeding with next intervention.   8:37 PM Dr. Retta Dionesahlstedt returning page, would like pt to get toradol and  he will come see the pt now. Will await further instruction.   10:17 PM Per Dr. Lenoria Chimeahlstedt's note from 9:53pm "I will admit the patient for observation. I will see if we can put her on tomorrow for cystoscopy, right retrograde, right ureteroscopy, holmium laser lithotripsy and extraction of her right ureteral stone as well as possible right double-J stent.". Pt clicked over to admit, no holding orders placed since I have no heard from Dr. Retta Dionesahlstedt regarding if he will place his own orders. Please see his notes for further documentation of care.  BP 104/70 mmHg  Pulse 75  Temp(Src) 98.8 F (37.1 C) (Oral)  Resp 14  SpO2 99%  LMP 05/20/2015  Meds ordered this encounter  Medications  . sodium chloride 0.9 % bolus 1,000 mL    Sig:   . morphine 4 MG/ML injection 4 mg    Sig:   . ondansetron (ZOFRAN) injection 4 mg    Sig:   . cefTRIAXone (ROCEPHIN) 1 g in dextrose 5 % 50 mL IVPB    Sig:   . ketorolac (TORADOL) 30 MG/ML injection 30 mg    Sig:      Suan Pyeatt Camprubi-Soms, PA-C 06/03/15 2219  Courteney Lyn Mackuen, MD 06/03/15 2316

## 2015-06-03 NOTE — Progress Notes (Signed)
EDCM spoke to patient at bedside. Patient confirms she [place] a pcp [place]  [facility], informed patient of services there and walk in times.  EDCM also provided patient with list of pcps who accept self pay patients, list of discount pharmacies and websites needymeds.org and GoodRX.com for medication assistance, phone number to inquire about the orange card, phone number to inquire about Mediciad, phone number to inquire about the Affordable Care Act, financial resources in the community such as local churches, [employer], urban ministries, and dental assistance for uninsured patients.  Patient thankful for resources.  No further EDCM needs at this time. Patient reports she has just received insurance through her job, but it hasn't become effective yet.

## 2015-06-04 ENCOUNTER — Observation Stay (HOSPITAL_COMMUNITY): Payer: Self-pay | Admitting: Anesthesiology

## 2015-06-04 ENCOUNTER — Encounter (HOSPITAL_COMMUNITY): Payer: Self-pay

## 2015-06-04 ENCOUNTER — Encounter (HOSPITAL_COMMUNITY)
Admission: EM | Disposition: A | Discharge status: Home / Self Care | Payer: Self-pay | Source: Home / Self Care | Attending: Physician Assistant

## 2015-06-04 HISTORY — PX: STONE EXTRACTION WITH BASKET: SHX5318

## 2015-06-04 HISTORY — PX: HOLMIUM LASER APPLICATION: SHX5852

## 2015-06-04 HISTORY — PX: CYSTOSCOPY WITH RETROGRADE PYELOGRAM, URETEROSCOPY AND STENT PLACEMENT: SHX5789

## 2015-06-04 LAB — SURGICAL PCR SCREEN
MRSA, PCR: NEGATIVE
Staphylococcus aureus: POSITIVE — AB

## 2015-06-04 SURGERY — CYSTOURETEROSCOPY, WITH RETROGRADE PYELOGRAM AND STENT INSERTION
Anesthesia: General | Laterality: Right

## 2015-06-04 MED ORDER — ONDANSETRON HCL 4 MG/2ML IJ SOLN
4.0000 mg | INTRAMUSCULAR | Status: DC | PRN
  Administered 2015-06-04: 4 mg via INTRAVENOUS
  Filled 2015-06-04: qty 2

## 2015-06-04 MED ORDER — ZOLPIDEM TARTRATE 5 MG PO TABS: 5.0000 mg | ORAL_TABLET | Freq: Every evening | ORAL | Status: DC | PRN

## 2015-06-04 MED ORDER — IOHEXOL 300 MG/ML  SOLN
INTRAMUSCULAR | Status: DC | PRN
  Administered 2015-06-04: 10 mL via INTRAVENOUS

## 2015-06-04 MED ORDER — FENTANYL CITRATE (PF) 100 MCG/2ML IJ SOLN
INTRAMUSCULAR | Status: AC
  Filled 2015-06-04: qty 2

## 2015-06-04 MED ORDER — DOCUSATE SODIUM 100 MG PO CAPS
100.0000 mg | ORAL_CAPSULE | Freq: Two times a day (BID) | ORAL | Status: DC
  Administered 2015-06-04: 100 mg via ORAL
  Filled 2015-06-04: qty 1

## 2015-06-04 MED ORDER — ACETAMINOPHEN 325 MG PO TABS: 650.0000 mg | ORAL_TABLET | ORAL | Status: DC | PRN

## 2015-06-04 MED ORDER — TRAMADOL HCL 50 MG PO TABS: 50.0000 mg | ORAL_TABLET | Freq: Four times a day (QID) | ORAL | Status: DC | PRN

## 2015-06-04 MED ORDER — CEFTRIAXONE SODIUM 1 G IJ SOLR
1.0000 g | INTRAMUSCULAR | Status: DC
  Administered 2015-06-04: 1 g via INTRAVENOUS
  Filled 2015-06-04: qty 10

## 2015-06-04 MED ORDER — PROMETHAZINE HCL 25 MG/ML IJ SOLN: 6.2500 mg | INTRAMUSCULAR | Status: DC | PRN

## 2015-06-04 MED ORDER — SODIUM CHLORIDE 0.9 % IR SOLN
Status: DC | PRN
  Administered 2015-06-04: 1000 mL

## 2015-06-04 MED ORDER — ONDANSETRON HCL 4 MG/2ML IJ SOLN
INTRAMUSCULAR | Status: AC
  Filled 2015-06-04: qty 2

## 2015-06-04 MED ORDER — SODIUM CHLORIDE 0.9 % IR SOLN
Status: DC | PRN
  Administered 2015-06-04: 3000 mL

## 2015-06-04 MED ORDER — KETOROLAC TROMETHAMINE 30 MG/ML IJ SOLN
30.0000 mg | Freq: Once | INTRAMUSCULAR | Status: AC
  Administered 2015-06-04: 30 mg via INTRAVENOUS
  Filled 2015-06-04: qty 1

## 2015-06-04 MED ORDER — MIDAZOLAM HCL 5 MG/5ML IJ SOLN
INTRAMUSCULAR | Status: DC | PRN
  Administered 2015-06-04: 2 mg via INTRAVENOUS

## 2015-06-04 MED ORDER — MIDAZOLAM HCL 2 MG/2ML IJ SOLN
INTRAMUSCULAR | Status: AC
  Filled 2015-06-04: qty 2

## 2015-06-04 MED ORDER — PROPOFOL 10 MG/ML IV BOLUS
INTRAVENOUS | Status: AC
  Filled 2015-06-04: qty 20

## 2015-06-04 MED ORDER — LACTATED RINGERS IV SOLN
INTRAVENOUS | Status: DC
Start: 2015-06-04 — End: 2015-06-04
  Administered 2015-06-04: 1000 mL via INTRAVENOUS

## 2015-06-04 MED ORDER — CEFAZOLIN SODIUM-DEXTROSE 2-3 GM-% IV SOLR
INTRAVENOUS | Status: AC
  Filled 2015-06-04: qty 50

## 2015-06-04 MED ORDER — PROPOFOL 10 MG/ML IV BOLUS
INTRAVENOUS | Status: DC | PRN
  Administered 2015-06-04: 150 mg via INTRAVENOUS

## 2015-06-04 MED ORDER — MORPHINE SULFATE (PF) 2 MG/ML IV SOLN
2.0000 mg | INTRAVENOUS | Status: DC | PRN
  Administered 2015-06-04: 4 mg via INTRAVENOUS
  Administered 2015-06-04 (×2): 2 mg via INTRAVENOUS
  Filled 2015-06-04: qty 1
  Filled 2015-06-04: qty 2
  Filled 2015-06-04: qty 1

## 2015-06-04 MED ORDER — FENTANYL CITRATE (PF) 100 MCG/2ML IJ SOLN
INTRAMUSCULAR | Status: DC | PRN
  Administered 2015-06-04 (×2): 50 ug via INTRAVENOUS

## 2015-06-04 MED ORDER — ENOXAPARIN SODIUM 40 MG/0.4ML ~~LOC~~ SOLN
40.0000 mg | Freq: Every day | SUBCUTANEOUS | Status: DC
  Administered 2015-06-04: 40 mg via SUBCUTANEOUS
  Filled 2015-06-04: qty 0.4

## 2015-06-04 MED ORDER — LIDOCAINE HCL (CARDIAC) 20 MG/ML IV SOLN
INTRAVENOUS | Status: AC
  Filled 2015-06-04: qty 5

## 2015-06-04 MED ORDER — SODIUM CHLORIDE 0.45 % IV SOLN
INTRAVENOUS | Status: DC
  Administered 2015-06-04: 03:00:00 via INTRAVENOUS

## 2015-06-04 MED ORDER — DEXAMETHASONE SODIUM PHOSPHATE 10 MG/ML IJ SOLN
INTRAMUSCULAR | Status: DC | PRN
  Administered 2015-06-04: 10 mg via INTRAVENOUS

## 2015-06-04 MED ORDER — OXYCODONE HCL 5 MG PO TABS: 5.0000 mg | ORAL_TABLET | ORAL | Status: DC | PRN

## 2015-06-04 MED ORDER — CEFAZOLIN SODIUM-DEXTROSE 2-3 GM-% IV SOLR
INTRAVENOUS | Status: DC | PRN
  Administered 2015-06-04: 2 g via INTRAVENOUS

## 2015-06-04 MED ORDER — HYDROMORPHONE HCL 1 MG/ML IJ SOLN: 0.2500 mg | INTRAMUSCULAR | Status: DC | PRN

## 2015-06-04 MED ORDER — LIDOCAINE HCL (CARDIAC) 20 MG/ML IV SOLN
INTRAVENOUS | Status: DC | PRN
  Administered 2015-06-04: 30 mg via INTRAVENOUS

## 2015-06-04 MED ORDER — SULFAMETHOXAZOLE-TRIMETHOPRIM 800-160 MG PO TABS: 1.0000 | ORAL_TABLET | Freq: Two times a day (BID) | ORAL | Status: DC

## 2015-06-04 SURGICAL SUPPLY — 28 items
BAG URO CATCHER STRL LF (DRAPE) ×3 IMPLANT
BASKET LASER NITINOL 1.9FR (BASKET) IMPLANT
BASKET ZERO TIP NITINOL 2.4FR (BASKET) IMPLANT
BSKT STON RTRVL 120 1.9FR (BASKET)
BSKT STON RTRVL ZERO TP 2.4FR (BASKET)
CATH INTERMIT  6FR 70CM (CATHETERS) ×3 IMPLANT
CLOTH BEACON ORANGE TIMEOUT ST (SAFETY) ×3 IMPLANT
EXTRACTOR STONE NITINOL NGAGE (UROLOGICAL SUPPLIES) ×3 IMPLANT
FIBER LASER FLEXIVA 1000 (UROLOGICAL SUPPLIES) IMPLANT
FIBER LASER FLEXIVA 200 (UROLOGICAL SUPPLIES) IMPLANT
FIBER LASER FLEXIVA 365 (UROLOGICAL SUPPLIES) ×3 IMPLANT
FIBER LASER FLEXIVA 550 (UROLOGICAL SUPPLIES) IMPLANT
FIBER LASER TRAC TIP (UROLOGICAL SUPPLIES) IMPLANT
GLOVE BIOGEL M 8.0 STRL (GLOVE) ×3 IMPLANT
GLOVE BIOGEL M STRL SZ7.5 (GLOVE) ×3 IMPLANT
GOWN STRL REUS W/ TWL XL LVL3 (GOWN DISPOSABLE) IMPLANT
GOWN STRL REUS W/TWL LRG LVL3 (GOWN DISPOSABLE) IMPLANT
GOWN STRL REUS W/TWL XL LVL3 (GOWN DISPOSABLE) ×6 IMPLANT
GUIDEWIRE ANG ZIPWIRE 038X150 (WIRE) IMPLANT
GUIDEWIRE STR DUAL SENSOR (WIRE) ×3 IMPLANT
IV NS 1000ML (IV SOLUTION) ×3
IV NS 1000ML BAXH (IV SOLUTION) ×1 IMPLANT
MANIFOLD NEPTUNE II (INSTRUMENTS) ×3 IMPLANT
PACK CYSTO (CUSTOM PROCEDURE TRAY) ×3 IMPLANT
SHEATH ACCESS URETERAL 38CM (SHEATH) IMPLANT
TUBING CONNECTING 10 (TUBING) ×2 IMPLANT
TUBING CONNECTING 10' (TUBING) ×1
WIRE COONS/BENSON .038X145CM (WIRE) IMPLANT

## 2015-06-04 NOTE — Anesthesia Procedure Notes (Signed)
Procedure Name: LMA Insertion Date/Time: 06/04/2015 3:07 PM Performed by: Early OsmondEARGLE, Lejla Moeser E Pre-anesthesia Checklist: Patient identified, Emergency Drugs available, Suction available and Patient being monitored Patient Re-evaluated:Patient Re-evaluated prior to inductionOxygen Delivery Method: Circle system utilized Preoxygenation: Pre-oxygenation with 100% oxygen Intubation Type: IV induction Ventilation: Mask ventilation without difficulty LMA: LMA inserted LMA Size: 4.0 Number of attempts: 1 Placement Confirmation: positive ETCO2,  CO2 detector and breath sounds checked- equal and bilateral Dental Injury: Teeth and Oropharynx as per pre-operative assessment

## 2015-06-04 NOTE — Progress Notes (Signed)
Patient discharged home, discharge instructions given and explained to patient and she verbalized understanding, denies any pain/distress. Accompanied home by friend. Skin intact, no wound noted.

## 2015-06-04 NOTE — Transfer of Care (Signed)
Immediate Anesthesia Transfer of Care Note  Patient: Audrey Gonzalez  Procedure(s) Performed: Procedure(s): CYSTOSCOPY WITH  RIGHT RETROGRADE PYELOGRAM,RIGHT URETEROSCOPY , LASER LITHOTRIPSY AND BASKET EXTRACTION OF STONE (Right) STONE EXTRACTION WITH BASKET (Right) HOLMIUM LASER APPLICATION (Right)  Patient Location: PACU  Anesthesia Type:General  Level of Consciousness:  sedated, patient cooperative and responds to stimulation  Airway & Oxygen Therapy:Patient Spontanous Breathing and Patient connected to face mask oxgen  Post-op Assessment:  Report given to PACU RN and Post -op Vital signs reviewed and stable  Post vital signs:  Reviewed and stable  Last Vitals:  Filed Vitals:   06/04/15 0522  BP: 93/65  Pulse: 82  Temp: 36.9 C  Resp: 16    Complications: No apparent anesthesia complications

## 2015-06-04 NOTE — Discharge Instructions (Signed)
POSTOPERATIVE CARE AFTER URETEROSCOPY ° ° ° °Diet ° °Once you have adequately recovered from anesthesia, you may gradually advance your diet, as tolerated, to your regular diet. ° °Activities ° °You may gradually increase your activities to your normal unrestricted level the day following your procedure. ° °Medications ° °You should resume all preoperative medications. If you are on aspirin-like compounds, you should not resume these until the blood clears from your urine. If given an antibiotic by the surgeon, take these until they are completed. You may also be given, if you have a stent, medications to decrease the urinary frequency and urgency. ° °Pain ° °After ureteroscopy, there may be some pain on the side of the scope. Take your pain medicine for this. Usually, this pain resolves within a day or 2. ° °Fever ° °Please report any fever over 100° to the doctor. ° °

## 2015-06-04 NOTE — Op Note (Signed)
Preoperative diagnosis: Right distal ureteral stone  Postoperative diagnosis: Same   Procedure: Cystoscopy, right retrograde ureteropyelogram, right ureteroscopy, holmium laser lithotripsy and extraction of right distal ureteral stone, double-J stent placement    Surgeon: Bertram MillardStephen M. Felicia Both, M.D.   Anesthesia: Gen.   Complications: None  Specimen(s): Stone fragments, to the patient  Drain(s): 6 x 24 contour stent  Indications: 29 year old female who presented yesterday with a several day history of right flank pain. CT scan in the emergency room revealed a moderate size right distal ureteral stone as well as a very large stone burden in her right lower pole. She has hydronephrosis from this lower ureteral stone. She presents at this time for ureteroscopic management, as outpatient pain management was not possible. We discussed holmium laser lithotripsy with ureteroscopy, risks and complications. She accepts these and desires to proceed.    Technique and findings: The patient was properly identified and marked in the holding area. She has been on IV Rocephin. She received 1 dose of Ancef in the operating room, as the Rocephin had been given approximately 23 hours ago. She was taken to the operating room where general anesthetic was administered. She was placed in the dorsolithotomy position. Genitalia and perineum were prepped and draped. Proper timeout was performed.  A 23 French cystoscope was advanced into her bladder. The bladder was inspected circumferentially. There were no tumors, foreign bodies or trabeculations. Ureteral orifices appeared normal in location and configuration. The right ureteral orifice was cannulated with a 6 [place] open-ended catheter.   Omnipaque was used to perform retrograde ureteropyelogram. This revealed a filling defect approximately 2-3 cm proximal to the ureterovesical junction. Ureter appeared normal below this. Very little contrast was able to get by the  filling defect which approximated the stone seen on prior CT scan. There was mild dilatation of the ureter from what I can see but a little contrast got by.  I negotiated a 0.038 inch sensor-tip guidewire through the ureteral catheter, by the stone and up into the upper pole calyces. The open-ended catheter and cystoscope were then removed. I used a 6 [place] dual-lumen short semirigid ureteroscope to negotiate the urethra, and passed this into the right ureteral orifice where the stone was encountered. The ureter was slightly dilated, so I tried to engage the stone with a basket. This was unsuccessful in dislodging it. I then used a 350  fiber to pass laser energy up to the stone. Energy settings were rate of 15, power of 0.5 J. The stone was fragmented into multiple tiny fragments, and 2-3 larger fragments which were then easily extracted with the engage basket. A final pass of the ureteroscope revealed no significant fragments in the ureter. The ureteroscope was removed. Because there was no significant trauma to the ureter, I did not leave a stent in. Fragments were recovered from the bladder which was then drained. The scope was then removed.  The patient was then awakened and taken to the PACU in stable condition. She tolerated the procedure well.

## 2015-06-04 NOTE — Anesthesia Postprocedure Evaluation (Signed)
  Anesthesia Post-op Note  Patient: Audrey Gonzalez  Procedure(s) Performed: Procedure(s): CYSTOSCOPY WITH  RIGHT RETROGRADE PYELOGRAM,RIGHT URETEROSCOPY , LASER LITHOTRIPSY AND BASKET EXTRACTION OF STONE (Right) STONE EXTRACTION WITH BASKET (Right) HOLMIUM LASER APPLICATION (Right)  Patient Location: PACU  Anesthesia Type:General  Level of Consciousness: awake  Airway and Oxygen Therapy: Patient Spontanous Breathing  Post-op Pain: mild  Post-op Assessment: Post-op Vital signs reviewed              Post-op Vital Signs: Reviewed  Last Vitals:  Filed Vitals:   06/04/15 1551  BP: 115/74  Pulse: 92  Temp: 36.4 C  Resp: 12    Complications: No apparent anesthesia complications

## 2015-06-04 NOTE — Progress Notes (Signed)
PHARMACY NOTE -  antibiotic renal dose adjustment   Pharmacy has been assisting with dosing of Ceftriaxone for UTI. Dosage remains stable at Ceftriaxone 1g IV q24h and need for further dosage adjustment appears unlikely at present.    Pharmacy will sign off at this time.  Please reconsult if a change in clinical status warrants re-evaluation of dosage.  Lynann Beaverhristine Shaye Elling PharmD, BCPS Pager [phone] 06/04/2015 7:36 AM

## 2015-06-04 NOTE — Anesthesia Preprocedure Evaluation (Signed)
Anesthesia Evaluation  Patient identified by MRN, date of birth, ID band Patient awake    Reviewed: Allergy & Precautions, NPO status , Patient's Chart, lab work & pertinent test results  Airway Mallampati: II  TM Distance: >3 FB Neck ROM: Full    Dental   Pulmonary neg pulmonary ROS,    breath sounds clear to auscultation       Cardiovascular  Rhythm:Regular Rate:Normal     Neuro/Psych    GI/Hepatic negative GI ROS, Neg liver ROS,   Endo/Other    Renal/GU      Musculoskeletal   Abdominal   Peds  Hematology   Anesthesia Other Findings   Reproductive/Obstetrics                             Anesthesia Physical Anesthesia Plan  ASA: II  Anesthesia Plan: General   Post-op Pain Management:    Induction: Intravenous  Airway Management Planned: LMA  Additional Equipment:   Intra-op Plan:   Post-operative Plan: Extubation in OR  Informed Consent: I have reviewed the patients History and Physical, chart, labs and discussed the procedure including the risks, benefits and alternatives for the proposed anesthesia with the patient or authorized representative who has indicated his/her understanding and acceptance.   Dental advisory given  Plan Discussed with: CRNA and Anesthesiologist  Anesthesia Plan Comments:         Anesthesia Quick Evaluation

## 2015-06-04 NOTE — Progress Notes (Signed)
Day of Surgery Subjective: Patient reports continued right flank pain. She has required a fair amount of pain medicine recently.  Objective: Vital signs in last 24 hours: Temp:  [98 F (36.7 C)-98.8 F (37.1 C)] 98.5 F (36.9 C) (11/17 0522) Pulse Rate:  [75-92] 82 (11/17 0522) Resp:  [14-20] 16 (11/17 0522) BP: (93-106)/(59-70) 93/65 mmHg (11/17 0522) SpO2:  [96 %-100 %] 99 % (11/17 0522) Weight:  [62.4 kg (137 lb 9.1 oz)] 62.4 kg (137 lb 9.1 oz) (11/17 0209)  Intake/Output from previous day: 11/16 0701 - 11/17 0700 In: 231.3 [I.V.:231.3] Out: 300 [Urine:300] Intake/Output this shift: Total I/O In: 643.8 [I.V.:643.8] Out: 350 [Urine:350]  Physical Exam:  Constitutional: Vital signs reviewed. WD WN in NAD   Eyes: PERRL, No scleral icterus.   Cardiovascular: RRR Pulmonary/Chest: Normal effort Abdominal: Soft. Non-tender, non-distended, bowel sounds are normal, no masses, organomegaly, or guarding present.  Genitourinary: Extremities: No cyanosis or edema   Lab Results:  Recent Labs  06/03/15 1817  HGB 12.4  HCT 38.5   BMET  Recent Labs  06/03/15 1817  NA 139  K 3.8  CL 106  CO2 26  GLUCOSE 89  BUN 13  CREATININE 0.85  CALCIUM 9.0   No results for input(s): LABPT, INR in the last 72 hours. No results for input(s): LABURIN in the last 72 hours. Results for orders placed or performed during the hospital encounter of 06/03/15  Surgical pcr screen     Status: Abnormal   Collection Time: 06/04/15 12:02 PM  Result Value Ref Range Status   MRSA, PCR NEGATIVE NEGATIVE Final   Staphylococcus aureus POSITIVE (A) NEGATIVE Final    Comment:        The Xpert SA Assay (FDA approved for NASAL specimens in patients over 30 years of age), is one component of a comprehensive surveillance program.  Test performance has been validated by [facility] for patients greater than or equal to 30 year old. It is not intended to diagnose infection nor to guide or monitor  treatment.     Studies/Results: Ct Renal Stone Study  06/03/2015  CLINICAL DATA:  Acute onset of right flank pain and increased urinary frequency. Initial encounter. EXAM: CT ABDOMEN AND PELVIS WITHOUT CONTRAST TECHNIQUE: Multidetector CT imaging of the abdomen and pelvis was performed following the standard protocol without IV contrast. COMPARISON:  Renal ultrasound performed 02/04/2015 FINDINGS: The visualized lung bases are clear. The liver and spleen are unremarkable in appearance. Mildly increased attenuation within the gallbladder may reflect sludge. The pancreas and adrenal glands are unremarkable. There is mild right-sided hydronephrosis, somewhat chronic in appearance. An obstructing large 9 x 7 mm stone is noted distally just above the right vesicoureteral junction. Scattered bilateral medullary calcifications are seen, along with a large 2.4 cm stone at the lower pole of the right kidney. There is also a 1.6 cm parenchymal calcification at the lower pole of the right kidney. The left kidney is otherwise unremarkable. No perinephric stranding is seen. No free fluid is identified. The small bowel is unremarkable in appearance. The stomach is within normal limits. No acute vascular abnormalities are seen. The appendix is normal in caliber and contains air, without evidence of appendicitis. The colon is grossly unremarkable in appearance. The bladder is mildly distended and grossly unremarkable. The uterus is within normal limits. The ovaries are grossly symmetric. Two left-sided tubal ligation clips are noted, though positioning may simply be transient in nature. No inguinal lymphadenopathy is seen. No acute osseous  abnormalities are identified. IMPRESSION: 1. Mild right-sided hydronephrosis, somewhat chronic in appearance, suggesting recurrent obstruction. Obstructing large 9 x 7 mm stone noted distally just above the right vesicoureteral junction. 2. Scattered bilateral medullary calcifications  noted, along with a large 2.4 cm stone at the lower pole of the right kidney. 1.6 cm parenchymal calcification also noted at the lower pole of the right kidney. 3. Mildly increased attenuation within the gallbladder may reflect sludge. Gallbladder otherwise unremarkable in appearance. Electronically Signed   By: Roanna Raider M.D.   On: 06/03/2015 18:57    Assessment/Plan:   We will proceed with cystoscopy, right retrograde ureteropyelogram, holmium laser and extraction of right distal ureteral stone. She will be stented following this.    Chelsea Aus 06/04/2015, 2:54 PM

## 2015-06-05 ENCOUNTER — Encounter (HOSPITAL_COMMUNITY): Payer: Self-pay | Admitting: Urology

## 2015-06-10 NOTE — Discharge Summary (Signed)
Patient ID: [place] Bahar MRN: [id number] DOB/AGE: 10/22/1985 29 y.o.  Admit date: 06/03/2015 Discharge date: 06/10/2015  Primary Care Physician:  No primary care provider on file.  Discharge Diagnoses:   Present on Admission:  . Ureteral stone  Consults:  None     Discharge Medications:   Medication List    STOP taking these medications        cephALEXin 500 MG capsule  Commonly known as:  KEFLEX     ibuprofen 200 MG tablet  Commonly known as:  ADVIL,MOTRIN      TAKE these medications        cyclobenzaprine 5 MG tablet  Commonly known as:  FLEXERIL  Take 1 tablet (5 mg total) by mouth 2 (two) times daily as needed for muscle spasms.     HYDROcodone-acetaminophen 5-325 MG tablet  Commonly known as:  NORCO/VICODIN  Take 1 tablet by mouth every 6 (six) hours as needed for pain.     sulfamethoxazole-trimethoprim 800-160 MG tablet  Commonly known as:  BACTRIM DS,SEPTRA DS  Take 1 tablet by mouth 2 (two) times daily.     traMADol 50 MG tablet  Commonly known as:  ULTRAM  Take 1 tablet (50 mg total) by mouth every 6 (six) hours as needed.         Significant Diagnostic Studies:  Ct Renal Stone Study  06/03/2015  CLINICAL DATA:  Acute onset of right flank pain and increased urinary frequency. Initial encounter. EXAM: CT ABDOMEN AND PELVIS WITHOUT CONTRAST TECHNIQUE: Multidetector CT imaging of the abdomen and pelvis was performed following the standard protocol without IV contrast. COMPARISON:  Renal ultrasound performed 02/04/2015 FINDINGS: The visualized lung bases are clear. The liver and spleen are unremarkable in appearance. Mildly increased attenuation within the gallbladder may reflect sludge. The pancreas and adrenal glands are unremarkable. There is mild right-sided hydronephrosis, somewhat chronic in appearance. An obstructing large 9 x 7 mm stone is noted distally just above the right vesicoureteral junction. Scattered bilateral medullary calcifications are  seen, along with a large 2.4 cm stone at the lower pole of the right kidney. There is also a 1.6 cm parenchymal calcification at the lower pole of the right kidney. The left kidney is otherwise unremarkable. No perinephric stranding is seen. No free fluid is identified. The small bowel is unremarkable in appearance. The stomach is within normal limits. No acute vascular abnormalities are seen. The appendix is normal in caliber and contains air, without evidence of appendicitis. The colon is grossly unremarkable in appearance. The bladder is mildly distended and grossly unremarkable. The uterus is within normal limits. The ovaries are grossly symmetric. Two left-sided tubal ligation clips are noted, though positioning may simply be transient in nature. No inguinal lymphadenopathy is seen. No acute osseous abnormalities are identified. IMPRESSION: 1. Mild right-sided hydronephrosis, somewhat chronic in appearance, suggesting recurrent obstruction. Obstructing large 9 x 7 mm stone noted distally just above the right vesicoureteral junction. 2. Scattered bilateral medullary calcifications noted, along with a large 2.4 cm stone at the lower pole of the right kidney. 1.6 cm parenchymal calcification also noted at the lower pole of the right kidney. 3. Mildly increased attenuation within the gallbladder may reflect sludge. Gallbladder otherwise unremarkable in appearance. Electronically Signed   By: Roanna RaiderJeffery  Chang M.D.   On: 06/03/2015 18:57    Brief H and P: For complete details please refer to admission H and P, but in brief the patient was admitted for urgent management of a symptomatic  right distal ureteral stone.  Hospital Course:  Active Problems:   Ureteral stone  Following stent placement, the patient had resolution of her pain and was discharged home following the endoscopic procedures/stent placement  Day of Discharge BP 106/75 mmHg  Pulse 77  Temp(Src) 98 F (36.7 C) (Oral)  Resp 20  Ht   (1.676 m)  Wt 62.4 kg (137 lb 9.1 oz)  BMI 22.21 kg/m2  SpO2 97%  LMP 05/20/2015  No results found for this or any previous visit (from the past 24 hour(s)).  Physical Exam: General: Alert and awake oriented x3 not in any acute distress. HEENT: anicteric sclera, pupils reactive to light and accommodation CVS: S1-S2 clear no murmur rubs or gallops Chest: clear to auscultation bilaterally, no wheezing rales or rhonchi Abdomen: soft nontender, nondistended, normal bowel sounds, no organomegaly Extremities: no cyanosis, clubbing or edema noted bilaterally Neuro: Cranial nerves II-XII intact, no focal neurological deficits  Disposition:  Home  Diet:  No restrictions  Activity:  No restrictions   Disposition and Follow-up:    We will arrange follow-up in her office  TESTS THAT NEED FOLLOW-UP  N/10 minutes A  DISCHARGE FOLLOW-UP     Follow-up Information    Follow up with Chelsea Aus, MD.   Specialty:  Urology   Why:  call to schedule followup   Contact information:   [address] [place] [address] [phone]       Time spent on Discharge:  10 minutes  Signed: Chelsea Aus 06/10/2015, 6:33 AM

## 2015-12-09 ENCOUNTER — Emergency Department (HOSPITAL_COMMUNITY)
Admission: EM | Admit: 2015-12-09 | Discharge: 2015-12-09 | Disposition: A | Discharge status: Home / Self Care | Payer: BLUE CROSS/BLUE SHIELD | Attending: Emergency Medicine | Admitting: Emergency Medicine

## 2015-12-09 ENCOUNTER — Encounter (HOSPITAL_COMMUNITY): Payer: Self-pay

## 2015-12-09 ENCOUNTER — Emergency Department (HOSPITAL_COMMUNITY): Payer: BLUE CROSS/BLUE SHIELD

## 2015-12-09 DIAGNOSIS — R509 Fever, unspecified: Secondary | ICD-10-CM | POA: Diagnosis not present

## 2015-12-09 DIAGNOSIS — N3000 Acute cystitis without hematuria: Secondary | ICD-10-CM

## 2015-12-09 DIAGNOSIS — E860 Dehydration: Secondary | ICD-10-CM

## 2015-12-09 DIAGNOSIS — J00 Acute nasopharyngitis [common cold]: Secondary | ICD-10-CM | POA: Diagnosis present

## 2015-12-09 DIAGNOSIS — Z79891 Long term (current) use of opiate analgesic: Secondary | ICD-10-CM | POA: Diagnosis not present

## 2015-12-09 MED ORDER — ACETAMINOPHEN 500 MG PO TABS
1000.0000 mg | ORAL_TABLET | Freq: Once | ORAL | Status: AC
  Administered 2015-12-09: 1000 mg via ORAL
  Filled 2015-12-09: qty 2

## 2015-12-09 MED ORDER — ALBUTEROL SULFATE HFA 108 (90 BASE) MCG/ACT IN AERS
2.0000 | INHALATION_SPRAY | Freq: Once | RESPIRATORY_TRACT | Status: AC
  Administered 2015-12-09: 2 via RESPIRATORY_TRACT
  Filled 2015-12-09: qty 6.7

## 2015-12-09 MED ORDER — IBUPROFEN 800 MG PO TABS
800.0000 mg | ORAL_TABLET | Freq: Once | ORAL | Status: AC
  Administered 2015-12-09: 800 mg via ORAL
  Filled 2015-12-09: qty 1

## 2015-12-09 MED ORDER — SODIUM CHLORIDE 0.9 % IV BOLUS (SEPSIS)
1000.0000 mL | Freq: Once | INTRAVENOUS | Status: AC
  Administered 2015-12-09: 1000 mL via INTRAVENOUS

## 2015-12-09 NOTE — ED Notes (Signed)
Patient transported to X-ray 

## 2015-12-09 NOTE — ED Notes (Signed)
Patient c/o flulike symptoms since 11/25/15. Patient c/o chills, clear sinus drainage, nausea, and body aches.

## 2015-12-09 NOTE — ED Provider Notes (Signed)
CSN: [id number]     Arrival date & time 12/09/15  1439 History   First MD Initiated Contact with Patient 12/09/15 1622     Chief Complaint  Patient presents with  . flu symptoms       HPI Pt presents with fever and cough without SOB. Reports some nausea without vomiting. Denies diarrhea. No abdominal pain or throat pain. Reports myalgias. No sick contacts. Symptoms are mild to moderate in severity   Past Medical History  Diagnosis Date  . Kidney stones    Past Surgical History  Procedure Laterality Date  . Tubal ligation    . Cystoscopy with retrograde pyelogram, ureteroscopy and stent placement Right 06/04/2015    Procedure: CYSTOSCOPY WITH  RIGHT RETROGRADE PYELOGRAM,RIGHT URETEROSCOPY , LASER LITHOTRIPSY AND BASKET EXTRACTION OF STONE;  Surgeon: Marcine Matar, MD;  Location: [facility];  Service: Urology;  Laterality: Right;  . Stone extraction with basket Right 06/04/2015    Procedure: STONE EXTRACTION WITH BASKET;  Surgeon: Marcine Matar, MD;  Location: [facility];  Service: Urology;  Laterality: Right;  . Holmium laser application Right 06/04/2015    Procedure: HOLMIUM LASER APPLICATION;  Surgeon: Marcine Matar, MD;  Location: [facility];  Service: Urology;  Laterality: Right;   History reviewed. No pertinent family history. Social History  Substance Use Topics  . Smoking status: Never Smoker   . Smokeless tobacco: None  . Alcohol Use: No   OB History    No data available     Review of Systems  All other systems reviewed and are negative.     Allergies  Review of patient's allergies indicates no known allergies.  Home Medications   Prior to Admission medications   Medication Sig Start Date End Date Taking? Authorizing Provider  Phenylephrine-Pheniramine-DM [facility] COLD & COUGH) 05-07-19 MG PACK Take 1 Package by mouth daily as needed (flu symptoms).   Yes Historical Provider, MD  cyclobenzaprine (FLEXERIL) 5 MG tablet Take 1 tablet (5 mg total) by mouth 2  (two) times daily as needed for muscle spasms. Patient not taking: Reported on 02/04/2015 08/28/12   Earley Favor, NP  HYDROcodone-acetaminophen (NORCO/VICODIN) 5-325 MG per tablet Take 1 tablet by mouth every 6 (six) hours as needed for pain. Patient not taking: Reported on 02/04/2015 08/28/12   Earley Favor, NP  sulfamethoxazole-trimethoprim (BACTRIM DS,SEPTRA DS) 800-160 MG tablet Take 1 tablet by mouth 2 (two) times daily. Patient not taking: Reported on 12/09/2015 06/04/15   Marcine Matar, MD  traMADol (ULTRAM) 50 MG tablet Take 1 tablet (50 mg total) by mouth every 6 (six) hours as needed. Patient not taking: Reported on 12/09/2015 06/04/15   Marcine Matar, MD   BP 111/73 mmHg  Pulse 105  Temp(Src) 101.2 F (38.4 C) (Oral)  Resp 16  SpO2 99%  LMP 11/25/2015 Physical Exam  Constitutional: She is oriented to person, place, and time. She appears well-developed and well-nourished. No distress.  HENT:  Head: Normocephalic and atraumatic.  Eyes: EOM are normal.  Neck: Normal range of motion.  Cardiovascular: Normal rate, regular rhythm and normal heart sounds.   Pulmonary/Chest: Effort normal and breath sounds normal.  Abdominal: Soft. She exhibits no distension. There is no tenderness.  Musculoskeletal: Normal range of motion.  Neurological: She is alert and oriented to person, place, and time.  Skin: Skin is warm and dry.  Psychiatric: She has a normal mood and affect. Judgment normal.  Nursing note and vitals reviewed.   ED Course  Procedures (including critical care time) Labs  Review Labs Reviewed - No data to display  Imaging Review Dg Chest 2 View  12/09/2015  CLINICAL DATA:  Chills.  Sinus drainage.  Fever and cough. EXAM: CHEST  2 VIEW COMPARISON:  04/14/2011 FINDINGS: Heart size is normal. Lungs are clear. No infiltrate, collapse or effusion. Chronic curvature of the spine. IMPRESSION: Lungs clear.  Scoliosis. Electronically Signed   By: Paulina FusiMark  Shogry M.D.   On:  12/09/2015 17:59   I have personally reviewed and evaluated these images and lab results as part of my medical decision-making.   EKG Interpretation None      MDM   Final diagnoses:  Dehydration  Acute cystitis without hematuria  Fever, unspecified fever cause    Likely viral URI. Will tx nausea. Fluids and cxr at this time  6:12 PM Feels better. Dc home. Albuterol for cough. hydrated    Azalia BilisKevin Jordan Caraveo, MD 12/09/15 [id number]

## 2015-12-25 ENCOUNTER — Ambulatory Visit: Payer: Self-pay | Admitting: Internal Medicine

## 2016-01-05 ENCOUNTER — Other Ambulatory Visit: Payer: Self-pay | Admitting: Internal Medicine

## 2016-01-05 ENCOUNTER — Encounter: Payer: Self-pay | Admitting: Internal Medicine

## 2016-01-05 ENCOUNTER — Ambulatory Visit (INDEPENDENT_AMBULATORY_CARE_PROVIDER_SITE_OTHER): Payer: BLUE CROSS/BLUE SHIELD | Admitting: Internal Medicine

## 2016-01-05 ENCOUNTER — Other Ambulatory Visit (HOSPITAL_COMMUNITY)
Admission: RE | Admit: 2016-01-05 | Discharge: 2016-01-05 | Disposition: A | Discharge status: Home / Self Care | Payer: BLUE CROSS/BLUE SHIELD | Source: Ambulatory Visit | Attending: Internal Medicine | Admitting: Internal Medicine

## 2016-01-05 VITALS — BP 110/66 | HR 70 | Temp 98.2°F | Resp 18 | Ht 65.0 in | Wt 135.0 lb

## 2016-01-05 DIAGNOSIS — Z01419 Encounter for gynecological examination (general) (routine) without abnormal findings: Secondary | ICD-10-CM | POA: Diagnosis present

## 2016-01-05 DIAGNOSIS — Z0000 Encounter for general adult medical examination without abnormal findings: Secondary | ICD-10-CM | POA: Diagnosis not present

## 2016-01-05 DIAGNOSIS — M2550 Pain in unspecified joint: Secondary | ICD-10-CM

## 2016-01-05 DIAGNOSIS — Z1159 Encounter for screening for other viral diseases: Secondary | ICD-10-CM | POA: Diagnosis not present

## 2016-01-05 DIAGNOSIS — Z130 Encounter for screening for diseases of the blood and blood-forming organs and certain disorders involving the immune mechanism: Secondary | ICD-10-CM

## 2016-01-05 DIAGNOSIS — Z79899 Other long term (current) drug therapy: Secondary | ICD-10-CM | POA: Diagnosis not present

## 2016-01-05 DIAGNOSIS — Z1151 Encounter for screening for human papillomavirus (HPV): Secondary | ICD-10-CM | POA: Diagnosis not present

## 2016-01-05 DIAGNOSIS — Z113 Encounter for screening for infections with a predominantly sexual mode of transmission: Secondary | ICD-10-CM

## 2016-01-05 DIAGNOSIS — Z13220 Encounter for screening for lipoid disorders: Secondary | ICD-10-CM

## 2016-01-05 DIAGNOSIS — Z1329 Encounter for screening for other suspected endocrine disorder: Secondary | ICD-10-CM

## 2016-01-05 DIAGNOSIS — Z124 Encounter for screening for malignant neoplasm of cervix: Secondary | ICD-10-CM

## 2016-01-05 DIAGNOSIS — R300 Dysuria: Secondary | ICD-10-CM | POA: Diagnosis not present

## 2016-01-05 DIAGNOSIS — Z131 Encounter for screening for diabetes mellitus: Secondary | ICD-10-CM

## 2016-01-05 DIAGNOSIS — E559 Vitamin D deficiency, unspecified: Secondary | ICD-10-CM

## 2016-01-05 DIAGNOSIS — Z136 Encounter for screening for cardiovascular disorders: Secondary | ICD-10-CM

## 2016-01-05 DIAGNOSIS — Z1389 Encounter for screening for other disorder: Secondary | ICD-10-CM

## 2016-01-05 LAB — HEPATIC FUNCTION PANEL
ALT: 6 U/L (ref 6–29)
AST: 12 U/L (ref 10–30)
Albumin: 4 g/dL (ref 3.6–5.1)
Alkaline Phosphatase: 71 U/L (ref 33–115)
BILIRUBIN DIRECT: 0.1 mg/dL (ref <=0.2)
BILIRUBIN TOTAL: 0.3 mg/dL (ref 0.2–1.2)
Indirect Bilirubin: 0.2 mg/dL (ref 0.2–1.2)
Total Protein: 7.2 g/dL (ref 6.1–8.1)

## 2016-01-05 LAB — LIPID PANEL
CHOL/HDL RATIO: 3.3 ratio (ref <=5.0)
CHOLESTEROL: 176 mg/dL (ref 125–200)
HDL: 53 mg/dL (ref >=46)
LDL Cholesterol: 105 mg/dL (ref <130)
TRIGLYCERIDES: 92 mg/dL (ref <150)
VLDL: 18 mg/dL (ref <30)

## 2016-01-05 LAB — BASIC METABOLIC PANEL WITH GFR
BUN: 17 mg/dL (ref 7–25)
CO2: 26 mmol/L (ref 20–31)
CREATININE: 0.83 mg/dL (ref 0.50–1.10)
Calcium: 8.9 mg/dL (ref 8.6–10.2)
Chloride: 102 mmol/L (ref 98–110)
GFR, Est Non African American: >89 mL/min (ref >=60)
Glucose, Bld: 81 mg/dL (ref 65–99)
Potassium: 4.2 mmol/L (ref 3.5–5.3)
SODIUM: 136 mmol/L (ref 135–146)

## 2016-01-05 LAB — CBC WITH DIFFERENTIAL/PLATELET
BASOS PCT: 0 %
Basophils Absolute: 0 cells/uL (ref 0–200)
EOS ABS: 108 {cells}/uL (ref 15–500)
EOS PCT: 2 %
HCT: 38.1 % (ref 35.0–45.0)
Hemoglobin: 12.2 g/dL (ref 11.7–15.5)
LYMPHS PCT: 37 %
Lymphs Abs: 1998 cells/uL (ref 850–3900)
MCH: 26.1 pg — AB (ref 27.0–33.0)
MCHC: 32 g/dL (ref 32.0–36.0)
MCV: 81.6 fL (ref 80.0–100.0)
MONOS PCT: 7 %
MPV: 10 fL (ref 7.5–12.5)
Monocytes Absolute: 378 cells/uL (ref 200–950)
NEUTROS ABS: 2916 {cells}/uL (ref 1500–7800)
Neutrophils Relative %: 54 %
PLATELETS: 227 10*3/uL (ref 140–400)
RBC: 4.67 MIL/uL (ref 3.80–5.10)
RDW: 15.1 % — AB (ref 11.0–15.0)
WBC: 5.4 10*3/uL (ref 3.8–10.8)

## 2016-01-05 LAB — IRON AND TIBC
%SAT: 27 % (ref 11–50)
Iron: 83 ug/dL (ref 40–190)
TIBC: 304 ug/dL (ref 250–450)
UIBC: 221 ug/dL (ref 125–400)

## 2016-01-05 LAB — HEMOGLOBIN A1C
Hgb A1c MFr Bld: 5.7 % — ABNORMAL HIGH (ref <5.7)
Mean Plasma Glucose: 117 mg/dL

## 2016-01-05 LAB — TSH: TSH: 1.4 mIU/L

## 2016-01-05 LAB — VITAMIN B12: VITAMIN B 12: 420 pg/mL (ref 200–1100)

## 2016-01-05 LAB — MAGNESIUM: Magnesium: 2 mg/dL (ref 1.5–2.5)

## 2016-01-05 LAB — CK: CK TOTAL: 69 U/L (ref 7–177)

## 2016-01-05 LAB — RHEUMATOID FACTOR: Rhuematoid fact SerPl-aCnc: 15 IU/mL — ABNORMAL HIGH (ref <=14)

## 2016-01-05 LAB — C-REACTIVE PROTEIN: CRP: <0.5 mg/dL (ref <0.60)

## 2016-01-05 LAB — URIC ACID: URIC ACID, SERUM: 3.5 mg/dL (ref 2.5–7.0)

## 2016-01-05 NOTE — Progress Notes (Signed)
Annual Screening Comprehensive Examination   This very nice 30 y.o.female presents for complete physical.  Patient has no major health issues.  Patient reports no complaints at this time.   She is currently working as a Production designer, theatre/television/filmmanager at [employer]. She has 3 kids so doesn't have much time for hobbies.    Finally, patient has history of Vitamin D Deficiency and last vitamin D was No results found for: VD25OH.  Currently on supplementation  She reports that she has had some irregular bleeding and spotting in between periods last month.  She reports that this does not usually happen.  She does not see obgyn.  She reports that periods previously regular.  She reports that her periods are not heavy.  She does not get a lot of cramping.  She has been with her current partner for the last two years.  Her last pap smear has been several years ago.  She has had a history of abnormal pap smear and that occurred only during her pregnancy.    She does notice that she has been having some swelling especially in her hands and her feet in the joints in her toes and fingers.  She has noticed this for many years.  She does have some increase in swelling during weather changes.  Hot showers and aleve do tend to help a little bit.  She also gets it in her wrist and her shoulder in the past.  She reports that there is a family history of her maternal aunt have lupus.    She does have a history of kidney stones which is followed by [facility].     No current outpatient prescriptions on file prior to visit.   No current facility-administered medications on file prior to visit.    No Known Allergies  Past Medical History  Diagnosis Date  . Kidney stones      There is no immunization history on file for this patient.  Past Surgical History  Procedure Laterality Date  . Tubal ligation    . Cystoscopy with retrograde pyelogram, ureteroscopy and stent placement Right 06/04/2015    Procedure: CYSTOSCOPY WITH   RIGHT RETROGRADE PYELOGRAM,RIGHT URETEROSCOPY , LASER LITHOTRIPSY AND BASKET EXTRACTION OF STONE;  Surgeon: Marcine MatarStephen Dahlstedt, MD;  Location: [facility];  Service: Urology;  Laterality: Right;  . Stone extraction with basket Right 06/04/2015    Procedure: STONE EXTRACTION WITH BASKET;  Surgeon: Marcine MatarStephen Dahlstedt, MD;  Location: [facility];  Service: Urology;  Laterality: Right;  . Holmium laser application Right 06/04/2015    Procedure: HOLMIUM LASER APPLICATION;  Surgeon: Marcine MatarStephen Dahlstedt, MD;  Location: [facility];  Service: Urology;  Laterality: Right;    No family history on file.  Social History   Social History  . Marital Status: Single    Spouse Name: N/A  . Number of Children: N/A  . Years of Education: N/A   Occupational History  . Not on file.   Social History Main Topics  . Smoking status: Never Smoker   . Smokeless tobacco: Not on file  . Alcohol Use: No  . Drug Use: No  . Sexual Activity: Yes    Birth Control/ Protection: Surgical   Other Topics Concern  . Not on file   Social History Narrative   Review of Systems  Constitutional: Negative for fever, chills and malaise/fatigue.  HENT: Negative for congestion, ear pain and sore throat.   Eyes: Negative.   Respiratory: Negative for cough, shortness of breath and wheezing.  Cardiovascular: Negative for chest pain, palpitations and leg swelling.  Gastrointestinal: Negative for heartburn, abdominal pain, diarrhea, constipation, blood in stool and melena.  Genitourinary: Negative.   Musculoskeletal: Positive for joint pain. Negative for myalgias, back pain, falls and neck pain.  Skin: Negative.   Neurological: Negative for dizziness, sensory change, loss of consciousness and headaches.  Psychiatric/Behavioral: Negative for depression. The patient is not nervous/anxious and does not have insomnia.       Physical Exam  BP 110/66 mmHg  Pulse 70  Temp(Src) 98.2 F (36.8 C) (Temporal)  Resp 18  Ht  (1.651 m)  Wt  135 lb (61.236 kg)  BMI 22.47 kg/m2  LMP 12/24/2015  General Appearance: Well nourished and in no apparent distress. Eyes: PERRLA, EOMs, conjunctiva no swelling or erythema, normal fundi and vessels. Sinuses: No frontal/maxillary tenderness ENT/Mouth: EACs patent / TMs  nl. Nares clear without erythema, swelling, mucoid exudates. Oral hygiene is good. No erythema, swelling, or exudate. Tongue normal, non-obstructing. Tonsils not swollen or erythematous. Hearing normal. Rhinophyma present. Neck: Supple, thyroid normal. No bruits, nodes or JVD. Respiratory: Respiratory effort normal.  BS equal and clear bilateral without rales, rhonci, wheezing or stridor. Cardio: Heart sounds are normal with regular rate and rhythm and no murmurs, rubs or gallops. Peripheral pulses are normal and equal bilaterally without edema. No aortic or femoral bruits. Chest: symmetric with normal excursions and percussion. Breasts: Symmetric, without lumps, nipple discharge, retractions, or fibrocystic changes.  Abdomen: Flat, soft, with bowl sounds. Nontender, no guarding, rebound, hernias, masses, or organomegaly.  Lymphatics: Non tender without lymphadenopathy.  Genitourinary: Normal external female genitalia.  Vaginal canal normal without erythema, multiparous cervix.  Thin white discharge in the vaginal vault.  No CMT tenderness.  NO palpable masses on ovaries.   Musculoskeletal: Full ROM all peripheral extremities, joint stability, 5/5 strength, and normal gait.  Swelling to the PIP of bilateral middle and ring fingers.  Mild swelling to the right wrist.  Tenderness to palpation of the joint line.   Skin: Warm and dry without rashes, lesions, cyanosis, clubbing or  ecchymosis. Small 0.5 mm dark brown to black nevi on the right pinky finger. Neuro: Cranial nerves intact, reflexes equal bilaterally. Normal muscle tone, no cerebellar symptoms. Sensation intact.  Pysch: Awake and oriented X 3, normal affect, Insight and  Judgment appropriate.   Assessment and Plan    1. Routine general medical examination at a health care facility  - CBC with Differential/Platelet - BASIC METABOLIC PANEL WITH GFR - Hepatic function panel - Magnesium  2. Arthralgia -given swelling of small joints and several larger joints feel it prudent to look for possible autoimmune disease.  She also has family history of lupus. - Sedimentation rate - Angiotensin converting enzyme - Anti-DNA antibody, double-stranded - ANA - C3 and C4 - Cyclic citrul peptide antibody, IgG - C-reactive protein - Rheumatoid factor - CK - Uric acid - Complement, total - ANCA screen with reflex titer - Sjogrens syndrome-A extractable nuclear antibody  3. Screening for cervical cancer -due next year - Cytology - PAP  4. Screening for diabetes mellitus  - Hemoglobin A1c - Insulin, random  5. Screening for hyperlipidemia  - Lipid panel  6. Screening for thyroid disorder -mild thyromegaly on exam -if abnormal consider imaging - TSH  7. Screening for deficiency anemia  - Iron and TIBC - Vitamin B12  8. Screening for hematuria or proteinuria  - Urinalysis, Routine w reflex microscopic (not at [facility]) - Microalbumin / creatinine  urine ratio  9. Screening for cardiovascular condition -get baseline EKG at next visit  10. Screening for STD (sexually transmitted disease)  - HIV antibody - RPR - WET PREP BY MOLECULAR PROBE - GC/Chlamydia Probe Amp  11. Vitamin D deficiency -start vit D supplement - VITAMIN D 25 Hydroxy (Vit-D Deficiency, Fractures)  Continue prudent diet as discussed, weight control, regular exercise, and medications. Routine screening labs and tests as requested with regular follow-up as recommended.  Over 40 minutes of exam, counseling, chart review and critical decision making was performed

## 2016-01-05 NOTE — Patient Instructions (Signed)
Preventive Care for Adults A healthy lifestyle and preventive care can promote health and wellness. Preventive health guidelines for women include the following key practices.  A routine yearly physical is a good way to check with your health care provider about your health and preventive screening. It is a chance to share any concerns and updates on your health and to receive a thorough exam.  Visit your dentist for a routine exam and preventive care every 6 months. Brush your teeth twice a day and floss once a day. Good oral hygiene prevents tooth decay and gum disease.  The frequency of eye exams is based on your age, health, family medical history, use of contact lenses, and other factors. Follow your health care provider's recommendations for frequency of eye exams.  Eat a healthy diet. Foods like vegetables, fruits, whole grains, low-fat dairy products, and lean protein foods contain the nutrients you need without too many calories. Decrease your intake of foods high in solid fats, added sugars, and salt. Eat the right amount of calories for you.Get information about a proper diet from your health care provider, if necessary.  Regular physical exercise is one of the most important things you can do for your health. Most adults should get at least 150 minutes of moderate-intensity exercise (any activity that increases your heart rate and causes you to sweat) each week. In addition, most adults need muscle-strengthening exercises on 2 or more days a week.  Maintain a healthy weight. The body mass index (BMI) is a screening tool to identify possible weight problems. It provides an estimate of body fat based on height and weight. Your health care provider can find your BMI and can help you achieve or maintain a healthy weight.For adults 20 years and older:  A BMI below 18.5 is considered underweight.  A BMI of 18.5 to 24.9 is normal.  A BMI of 25 to 29.9 is considered overweight.  A BMI of  30 and above is considered obese.  Maintain normal blood lipids and cholesterol levels by exercising and minimizing your intake of saturated fat. Eat a balanced diet with plenty of fruit and vegetables. Blood tests for lipids and cholesterol should begin at age 20 and be repeated every 5 years. If your lipid or cholesterol levels are high, you are over 50, or you are at high risk for heart disease, you may need your cholesterol levels checked more frequently.Ongoing high lipid and cholesterol levels should be treated with medicines if diet and exercise are not working.  If you smoke, find out from your health care provider how to quit. If you do not use tobacco, do not start.  Lung cancer screening is recommended for adults aged 55-80 years who are at high risk for developing lung cancer because of a history of smoking. A yearly low-dose CT scan of the lungs is recommended for people who have at least a 30-pack-year history of smoking and are a current smoker or have quit within the past 15 years. A pack year of smoking is smoking an average of 1 pack of cigarettes a day for 1 year (for example: 1 pack a day for 30 years or 2 packs a day for 15 years). Yearly screening should continue until the smoker has stopped smoking for at least 15 years. Yearly screening should be stopped for people who develop a health problem that would prevent them from having lung cancer treatment.  If you are pregnant, do not drink alcohol. If you are breastfeeding,   breastfeeding, be very cautious about drinking alcohol. If you are not pregnant and choose to drink alcohol, do not have more than 1 drink per day. One drink is considered to be 12 ounces (355 mL) of beer, 5 ounces (148 mL) of wine, or 1.5 ounces (44 mL) of liquor.  Avoid use of street drugs. Do not share needles with anyone. Ask for help if you need support or instructions about stopping the use of drugs.  High blood pressure causes heart disease and increases the risk of  stroke. Your blood pressure should be checked at least every 1 to 2 years. Ongoing high blood pressure should be treated with medicines if weight loss and exercise do not work.  If you are 3-31 years old, ask your health care provider if you should take aspirin to prevent strokes.  Diabetes screening involves taking a blood sample to check your fasting blood sugar level. This should be done once every 3 years, after age 31, if you are within normal weight and without risk factors for diabetes. Testing should be considered at a younger age or be carried out more frequently if you are overweight and have at least 1 risk factor for diabetes.  Breast cancer screening is essential preventive care for women. You should practice "breast self-awareness." This means understanding the normal appearance and feel of your breasts and may include breast self-examination. Any changes detected, no matter how small, should be reported to a health care provider. Women in their 76s and 30s should have a clinical breast exam (CBE) by a health care provider as part of a regular health exam every 1 to 3 years. After age 65, women should have a CBE every year. Starting at age 67, women should consider having a mammogram (breast X-ray test) every year. Women who have a family history of breast cancer should talk to their health care provider about genetic screening. Women at a high risk of breast cancer should talk to their health care providers about having an MRI and a mammogram every year.  Breast cancer gene (BRCA)-related cancer risk assessment is recommended for women who have family members with BRCA-related cancers. BRCA-related cancers include breast, ovarian, tubal, and peritoneal cancers. Having family members with these cancers may be associated with an increased risk for harmful changes (mutations) in the breast cancer genes BRCA1 and BRCA2. Results of the assessment will determine the need for genetic counseling and  BRCA1 and BRCA2 testing.  Routine pelvic exams to screen for cancer are no longer recommended for nonpregnant women who are considered low risk for cancer of the pelvic organs (ovaries, uterus, and vagina) and who do not have symptoms. Ask your health care provider if a screening pelvic exam is right for you.  If you have had past treatment for cervical cancer or a condition that could lead to cancer, you need Pap tests and screening for cancer for at least 20 years after your treatment. If Pap tests have been discontinued, your risk factors (such as having a new sexual partner) need to be reassessed to determine if screening should be resumed. Some women have medical problems that increase the chance of getting cervical cancer. In these cases, your health care provider may recommend more frequent screening and Pap tests.  The HPV test is an additional test that may be used for cervical cancer screening. The HPV test looks for the virus that can cause the cell changes on the cervix. The cells collected during the Pap test can  be tested for HPV. The HPV test could be used to screen women aged 98 years and older, and should be used in women of any age who have unclear Pap test results. After the age of 59, women should have HPV testing at the same frequency as a Pap test.  Colorectal cancer can be detected and often prevented. Most routine colorectal cancer screening begins at the age of 35 years and continues through age 61 years. However, your health care provider may recommend screening at an earlier age if you have risk factors for colon cancer. On a yearly basis, your health care provider may provide home test kits to check for hidden blood in the stool. Use of a small camera at the end of a tube, to directly examine the colon (sigmoidoscopy or colonoscopy), can detect the earliest forms of colorectal cancer. Talk to your health care provider about this at age 18, when routine screening begins. Direct  exam of the colon should be repeated every 5-10 years through age 67 years, unless early forms of pre-cancerous polyps or small growths are found.  People who are at an increased risk for hepatitis B should be screened for this virus. You are considered at high risk for hepatitis B if:  You were born in a country where hepatitis B occurs often. Talk with your health care provider about which countries are considered high risk.  Your parents were born in a high-risk country and you have not received a shot to protect against hepatitis B (hepatitis B vaccine).  You have HIV or AIDS.  You use needles to inject street drugs.  You live with, or have sex with, someone who has hepatitis B.  You get hemodialysis treatment.  You take certain medicines for conditions like cancer, organ transplantation, and autoimmune conditions.  Hepatitis C blood testing is recommended for all people born from [birth year] through 1965 and any individual with known risks for hepatitis C.  Practice safe sex. Use condoms and avoid high-risk sexual practices to reduce the spread of sexually transmitted infections (STIs). STIs include gonorrhea, chlamydia, syphilis, trichomonas, herpes, HPV, and human immunodeficiency virus (HIV). Herpes, HIV, and HPV are viral illnesses that have no cure. They can result in disability, cancer, and death.  You should be screened for sexually transmitted illnesses (STIs) including gonorrhea and chlamydia if:  You are sexually active and are younger than 24 years.  You are older than 24 years and your health care provider tells you that you are at risk for this type of infection.  Your sexual activity has changed since you were last screened and you are at an increased risk for chlamydia or gonorrhea. Ask your health care provider if you are at risk.  If you are at risk of being infected with HIV, it is recommended that you take a prescription medicine daily to prevent HIV infection. This is  called preexposure prophylaxis (PrEP). You are considered at risk if:  You are a heterosexual woman, are sexually active, and are at increased risk for HIV infection.  You take drugs by injection.  You are sexually active with a partner who has HIV.  Talk with your health care provider about whether you are at high risk of being infected with HIV. If you choose to begin PrEP, you should first be tested for HIV. You should then be tested every 3 months for as long as you are taking PrEP.  Osteoporosis is a disease in which the bones lose minerals and  strength with aging. This can result in serious bone fractures or breaks. The risk of osteoporosis can be identified using a bone density scan. Women ages 48 years and over and women at risk for fractures or osteoporosis should discuss screening with their health care providers. Ask your health care provider whether you should take a calcium supplement or vitamin D to reduce the rate of osteoporosis.  Menopause can be associated with physical symptoms and risks. Hormone replacement therapy is available to decrease symptoms and risks. You should talk to your health care provider about whether hormone replacement therapy is right for you.  Use sunscreen. Apply sunscreen liberally and repeatedly throughout the day. You should seek shade when your shadow is shorter than you. Protect yourself by wearing long sleeves, pants, a wide-brimmed hat, and sunglasses year round, whenever you are outdoors.  Once a month, do a whole body skin exam, using a mirror to look at the skin on your back. Tell your health care provider of new moles, moles that have irregular borders, moles that are larger than a pencil eraser, or moles that have changed in shape or color.  Stay current with required vaccines (immunizations).  Influenza vaccine. All adults should be immunized every year.  Tetanus, diphtheria, and acellular pertussis (Td, Tdap) vaccine. Pregnant women should  receive 1 dose of Tdap vaccine during each pregnancy. The dose should be obtained regardless of the length of time since the last dose. Immunization is preferred during the 27th-36th week of gestation. An adult who has not previously received Tdap or who does not know her vaccine status should receive 1 dose of Tdap. This initial dose should be followed by tetanus and diphtheria toxoids (Td) booster doses every 10 years. Adults with an unknown or incomplete history of completing a 3-dose immunization series with Td-containing vaccines should begin or complete a primary immunization series including a Tdap dose. Adults should receive a Td booster every 10 years.  Varicella vaccine. An adult without evidence of immunity to varicella should receive 2 doses or a second dose if she has previously received 1 dose. Pregnant females who do not have evidence of immunity should receive the first dose after pregnancy. This first dose should be obtained before leaving the health care facility. The second dose should be obtained 4-8 weeks after the first dose.  Human papillomavirus (HPV) vaccine. Females aged 13-26 years who have not received the vaccine previously should obtain the 3-dose series. The vaccine is not recommended for use in pregnant females. However, pregnancy testing is not needed before receiving a dose. If a female is found to be pregnant after receiving a dose, no treatment is needed. In that case, the remaining doses should be delayed until after the pregnancy. Immunization is recommended for any person with an immunocompromised condition through the age of 48 years if she did not get any or all doses earlier. During the 3-dose series, the second dose should be obtained 4-8 weeks after the first dose. The third dose should be obtained 24 weeks after the first dose and 16 weeks after the second dose.  Zoster vaccine. One dose is recommended for adults aged 67 years or older unless certain conditions are  present.  Measles, mumps, and rubella (MMR) vaccine. Adults born before [birth year] generally are considered immune to measles and mumps. Adults born in [birth year] or later should have 1 or more doses of MMR vaccine unless there is a contraindication to the vaccine or there is laboratory evidence of immunity  to each of the three diseases. A routine second dose of MMR vaccine should be obtained at least 28 days after the first dose for students attending postsecondary schools, health care workers, or international travelers. People who received inactivated measles vaccine or an unknown type of measles vaccine during 1963-1967 should receive 2 doses of MMR vaccine. People who received inactivated mumps vaccine or an unknown type of mumps vaccine before 1979 and are at high risk for mumps infection should consider immunization with 2 doses of MMR vaccine. For females of childbearing age, rubella immunity should be determined. If there is no evidence of immunity, females who are not pregnant should be vaccinated. If there is no evidence of immunity, females who are pregnant should delay immunization until after pregnancy. Unvaccinated health care workers born before [birth year] who lack laboratory evidence of measles, mumps, or rubella immunity or laboratory confirmation of disease should consider measles and mumps immunization with 2 doses of MMR vaccine or rubella immunization with 1 dose of MMR vaccine.  Pneumococcal 13-valent conjugate (PCV13) vaccine. When indicated, a person who is uncertain of her immunization history and has no record of immunization should receive the PCV13 vaccine. An adult aged 58 years or older who has certain medical conditions and has not been previously immunized should receive 1 dose of PCV13 vaccine. This PCV13 should be followed with a dose of pneumococcal polysaccharide (PPSV23) vaccine. The PPSV23 vaccine dose should be obtained at least 8 weeks after the dose of PCV13 vaccine. An adult aged 65  years or older who has certain medical conditions and previously received 1 or more doses of PPSV23 vaccine should receive 1 dose of PCV13. The PCV13 vaccine dose should be obtained 1 or more years after the last PPSV23 vaccine dose.  Pneumococcal polysaccharide (PPSV23) vaccine. When PCV13 is also indicated, PCV13 should be obtained first. All adults aged 18 years and older should be immunized. An adult younger than age 74 years who has certain medical conditions should be immunized. Any person who resides in a nursing home or long-term care facility should be immunized. An adult smoker should be immunized. People with an immunocompromised condition and certain other conditions should receive both PCV13 and PPSV23 vaccines. People with human immunodeficiency virus (HIV) infection should be immunized as soon as possible after diagnosis. Immunization during chemotherapy or radiation therapy should be avoided. Routine use of PPSV23 vaccine is not recommended for American Indians, [place] Natives, or people younger than 65 years unless there are medical conditions that require PPSV23 vaccine. When indicated, people who have unknown immunization and have no record of immunization should receive PPSV23 vaccine. One-time revaccination 5 years after the first dose of PPSV23 is recommended for people aged 19-64 years who have chronic kidney failure, nephrotic syndrome, asplenia, or immunocompromised conditions. People who received 1-2 doses of PPSV23 before age 21 years should receive another dose of PPSV23 vaccine at age 42 years or later if at least 5 years have passed since the previous dose. Doses of PPSV23 are not needed for people immunized with PPSV23 at or after age 61 years.  Meningococcal vaccine. Adults with asplenia or persistent complement component deficiencies should receive 2 doses of quadrivalent meningococcal conjugate (MenACWY-D) vaccine. The doses should be obtained at least 2 months apart.  Microbiologists working with certain meningococcal bacteria, [employer], people at risk during an outbreak, and people who travel to or live in countries with a high rate of meningitis should be immunized. A first-year college student up through  age 23 years who is living in a residence hall should receive a dose if she did not receive a dose on or after her 16th birthday. Adults who have certain high-risk conditions should receive one or more doses of vaccine.  Hepatitis A vaccine. Adults who wish to be protected from this disease, have certain high-risk conditions, work with hepatitis A-infected animals, work in hepatitis A research labs, or travel to or work in countries with a high rate of hepatitis A should be immunized. Adults who were previously unvaccinated and who anticipate close contact with an international adoptee during the first 60 days after arrival in the [place] from a country with a high rate of hepatitis A should be immunized.  Hepatitis B vaccine. Adults who wish to be protected from this disease, have certain high-risk conditions, may be exposed to blood or other infectious body fluids, are household contacts or sex partners of hepatitis B positive people, are clients or workers in certain care facilities, or travel to or work in countries with a high rate of hepatitis B should be immunized.  Haemophilus influenzae type b (Hib) vaccine. A previously unvaccinated person with asplenia or sickle cell disease or having a scheduled splenectomy should receive 1 dose of Hib vaccine. Regardless of previous immunization, a recipient of a hematopoietic stem cell transplant should receive a 3-dose series 6-12 months after her successful transplant. Hib vaccine is not recommended for adults with HIV infection. Preventive Services / Frequency  Ages 65 to 22 years  Blood pressure check.  Lipid and cholesterol check.  Clinical breast exam.** / Every 3 years for women in their 53s  and 35s.  BRCA-related cancer risk assessment.** / For women who have family members with a BRCA-related cancer (breast, ovarian, tubal, or peritoneal cancers).  Pap test.** / Every 2 years from ages 41 through 44. Every 3 years starting at age 64 through age 28 or 21 with a history of 3 consecutive normal Pap tests.  HPV screening.** / Every 3 years from ages 9 through ages 56 to 48 with a history of 3 consecutive normal Pap tests.  Hepatitis C blood test.** / For any individual with known risks for hepatitis C.  Skin self-exam. / Monthly.  Influenza vaccine. / Every year.  Tetanus, diphtheria, and acellular pertussis (Tdap, Td) vaccine.** / Consult your health care provider. Pregnant women should receive 1 dose of Tdap vaccine during each pregnancy. 1 dose of Td every 10 years.  Varicella vaccine.** / Consult your health care provider. Pregnant females who do not have evidence of immunity should receive the first dose after pregnancy.  HPV vaccine. / 3 doses over 6 months, if 67 and younger. The vaccine is not recommended for use in pregnant females. However, pregnancy testing is not needed before receiving a dose.  Measles, mumps, rubella (MMR) vaccine.** / You need at least 1 dose of MMR if you were born in 1957 or later. You may also need a 2nd dose. For females of childbearing age, rubella immunity should be determined. If there is no evidence of immunity, females who are not pregnant should be vaccinated. If there is no evidence of immunity, females who are pregnant should delay immunization until after pregnancy.  Pneumococcal 13-valent conjugate (PCV13) vaccine.** / Consult your health care provider.  Pneumococcal polysaccharide (PPSV23) vaccine.** / 1 to 2 doses if you smoke cigarettes or if you have certain conditions.  Meningococcal vaccine.** / 1 dose if you are age 61 to 21 years and  a Market researcher living in a residence hall, or have one of several medical  conditions, you need to get vaccinated against meningococcal disease. You may also need additional booster doses.  Hepatitis A vaccine.** / Consult your health care provider.  Hepatitis B vaccine.** / Consult your health care provider.  Haemophilus influenzae type b (Hib) vaccine.** / Consult your health care provider.

## 2016-01-06 LAB — URINALYSIS, ROUTINE W REFLEX MICROSCOPIC
Bilirubin Urine: NEGATIVE
Glucose, UA: NEGATIVE
HGB URINE DIPSTICK: NEGATIVE
KETONES UR: NEGATIVE
NITRITE: POSITIVE — AB
Protein, ur: NEGATIVE
Specific Gravity, Urine: 1.021 (ref 1.001–1.035)
pH: 6.5 (ref 5.0–8.0)

## 2016-01-06 LAB — MICROALBUMIN / CREATININE URINE RATIO
CREATININE, URINE: 165 mg/dL (ref 20–320)
MICROALB UR: 1.4 mg/dL
Microalb Creat Ratio: 8 mcg/mg creat (ref <30)

## 2016-01-06 LAB — VITAMIN D 25 HYDROXY (VIT D DEFICIENCY, FRACTURES): Vit D, 25-Hydroxy: 17 ng/mL — ABNORMAL LOW (ref 30–100)

## 2016-01-06 LAB — C3 AND C4
C3 COMPLEMENT: 113 mg/dL (ref 90–180)
C4 COMPLEMENT: 31 mg/dL (ref 16–47)

## 2016-01-06 LAB — URINALYSIS, MICROSCOPIC ONLY
CRYSTALS: NONE SEEN [HPF]
Casts: NONE SEEN [LPF]
RBC / HPF: NONE SEEN RBC/HPF (ref <=2)
YEAST: NONE SEEN [HPF]

## 2016-01-06 LAB — HIV ANTIBODY (ROUTINE TESTING W REFLEX): HIV: NONREACTIVE

## 2016-01-06 LAB — ANTI-DNA ANTIBODY, DOUBLE-STRANDED

## 2016-01-06 LAB — ANTI-NUCLEAR AB-TITER (ANA TITER)

## 2016-01-06 LAB — CYCLIC CITRUL PEPTIDE ANTIBODY, IGG: Cyclic Citrullin Peptide Ab: 181 Units — ABNORMAL HIGH

## 2016-01-06 LAB — ANA: Anti Nuclear Antibody(ANA): POSITIVE — AB

## 2016-01-06 LAB — GC/CHLAMYDIA PROBE AMP
CT PROBE, AMP APTIMA: NOT DETECTED
GC PROBE AMP APTIMA: NOT DETECTED

## 2016-01-06 LAB — SEDIMENTATION RATE: SED RATE: 7 mm/h (ref 0–20)

## 2016-01-06 LAB — SJOGRENS SYNDROME-A EXTRACTABLE NUCLEAR ANTIBODY: SSA (RO) (ENA) ANTIBODY, IGG: NEGATIVE

## 2016-01-06 LAB — ANGIOTENSIN CONVERTING ENZYME: ANGIOTENSIN-CONVERTING ENZYME: 69 U/L — AB (ref 8–52)

## 2016-01-06 LAB — WET PREP BY MOLECULAR PROBE
CANDIDA SPECIES: NEGATIVE
Gardnerella vaginalis: POSITIVE — AB
Trichomonas vaginosis: NEGATIVE

## 2016-01-06 LAB — INSULIN, RANDOM: Insulin: 9.5 u[IU]/mL (ref 2.0–19.6)

## 2016-01-06 LAB — RPR

## 2016-01-07 ENCOUNTER — Other Ambulatory Visit: Payer: Self-pay | Admitting: Internal Medicine

## 2016-01-07 DIAGNOSIS — M0579 Rheumatoid arthritis with rheumatoid factor of multiple sites without organ or systems involvement: Secondary | ICD-10-CM

## 2016-01-07 LAB — CYTOLOGY - PAP

## 2016-01-07 LAB — ANCA SCREEN W REFLEX TITER: ANCA Screen: NEGATIVE

## 2016-01-07 MED ORDER — METRONIDAZOLE 0.75 % VA GEL
1.0000 | Freq: Every day | VAGINAL | Status: DC
Start: 2016-01-07 — End: 2018-02-27

## 2016-01-08 ENCOUNTER — Other Ambulatory Visit: Payer: Self-pay | Admitting: Internal Medicine

## 2016-01-08 LAB — URINE CULTURE: Colony Count: >=100000

## 2016-01-08 LAB — COMPLEMENT, TOTAL: Compl, Total (CH50): 52 U/mL (ref 31–60)

## 2016-01-08 MED ORDER — CIPROFLOXACIN HCL 500 MG PO TABS: 500.0000 mg | ORAL_TABLET | Freq: Two times a day (BID) | ORAL | Status: AC

## 2016-02-05 ENCOUNTER — Other Ambulatory Visit: Payer: Self-pay | Admitting: Internal Medicine

## 2016-02-05 ENCOUNTER — Other Ambulatory Visit: Payer: BLUE CROSS/BLUE SHIELD

## 2016-02-05 DIAGNOSIS — N390 Urinary tract infection, site not specified: Secondary | ICD-10-CM | POA: Diagnosis not present

## 2016-02-05 DIAGNOSIS — N3000 Acute cystitis without hematuria: Secondary | ICD-10-CM

## 2016-02-06 LAB — URINALYSIS, ROUTINE W REFLEX MICROSCOPIC
BILIRUBIN URINE: NEGATIVE
Glucose, UA: NEGATIVE
HGB URINE DIPSTICK: NEGATIVE
KETONES UR: NEGATIVE
Leukocytes, UA: NEGATIVE
NITRITE: NEGATIVE
PH: 7.5 (ref 5.0–8.0)
Protein, ur: NEGATIVE
SPECIFIC GRAVITY, URINE: 1.021 (ref 1.001–1.035)

## 2016-02-07 LAB — URINE CULTURE: ORGANISM ID, BACTERIA: NO GROWTH

## 2017-10-29 ENCOUNTER — Emergency Department (HOSPITAL_COMMUNITY)
Admission: EM | Admit: 2017-10-29 | Discharge: 2017-10-29 | Disposition: A | Discharge status: Home / Self Care | Payer: BLUE CROSS/BLUE SHIELD | Attending: Emergency Medicine | Admitting: Emergency Medicine

## 2017-10-29 ENCOUNTER — Emergency Department (HOSPITAL_COMMUNITY): Payer: BLUE CROSS/BLUE SHIELD

## 2017-10-29 DIAGNOSIS — N3001 Acute cystitis with hematuria: Secondary | ICD-10-CM | POA: Diagnosis not present

## 2017-10-29 DIAGNOSIS — N132 Hydronephrosis with renal and ureteral calculous obstruction: Secondary | ICD-10-CM | POA: Diagnosis not present

## 2017-10-29 DIAGNOSIS — R1031 Right lower quadrant pain: Secondary | ICD-10-CM | POA: Diagnosis present

## 2017-10-29 DIAGNOSIS — N201 Calculus of ureter: Secondary | ICD-10-CM

## 2017-10-29 LAB — URINALYSIS, ROUTINE W REFLEX MICROSCOPIC
Bilirubin Urine: NEGATIVE
GLUCOSE, UA: NEGATIVE mg/dL
KETONES UR: NEGATIVE mg/dL
Nitrite: NEGATIVE
PH: 7 (ref 5.0–8.0)
Protein, ur: NEGATIVE mg/dL
Specific Gravity, Urine: 1.013 (ref 1.005–1.030)

## 2017-10-29 LAB — I-STAT BETA HCG BLOOD, ED (MC, WL, AP ONLY): I-stat hCG, quantitative: <5 m[IU]/mL (ref <5)

## 2017-10-29 LAB — CBC WITH DIFFERENTIAL/PLATELET
Basophils Absolute: 0 10*3/uL (ref 0.0–0.1)
Basophils Relative: 0 %
Eosinophils Absolute: 0.1 10*3/uL (ref 0.0–0.7)
Eosinophils Relative: 0 %
HCT: 34.9 % — ABNORMAL LOW (ref 36.0–46.0)
Hemoglobin: 11.2 g/dL — ABNORMAL LOW (ref 12.0–15.0)
LYMPHS ABS: 1.8 10*3/uL (ref 0.7–4.0)
LYMPHS PCT: 14 %
MCH: 26.9 pg (ref 26.0–34.0)
MCHC: 32.1 g/dL (ref 30.0–36.0)
MCV: 83.9 fL (ref 78.0–100.0)
MONO ABS: 0.7 10*3/uL (ref 0.1–1.0)
MONOS PCT: 6 %
NEUTROS ABS: 10.2 10*3/uL — AB (ref 1.7–7.7)
Neutrophils Relative %: 80 %
Platelets: 249 10*3/uL (ref 150–400)
RBC: 4.16 MIL/uL (ref 3.87–5.11)
RDW: 14.2 % (ref 11.5–15.5)
WBC: 12.8 10*3/uL — ABNORMAL HIGH (ref 4.0–10.5)

## 2017-10-29 LAB — BASIC METABOLIC PANEL
ANION GAP: 8 (ref 5–15)
BUN: 14 mg/dL (ref 6–20)
CO2: 25 mmol/L (ref 22–32)
Calcium: 8.4 mg/dL — ABNORMAL LOW (ref 8.9–10.3)
Chloride: 106 mmol/L (ref 101–111)
Creatinine, Ser: 0.81 mg/dL (ref 0.44–1.00)
GFR calc Af Amer: >60 mL/min (ref >60)
GFR calc non Af Amer: >60 mL/min (ref >60)
GLUCOSE: 109 mg/dL — AB (ref 65–99)
POTASSIUM: 4.1 mmol/L (ref 3.5–5.1)
Sodium: 139 mmol/L (ref 135–145)

## 2017-10-29 LAB — I-STAT CG4 LACTIC ACID, ED: Lactic Acid, Venous: 1.26 mmol/L (ref 0.5–1.9)

## 2017-10-29 MED ORDER — SODIUM CHLORIDE 0.9 % IV BOLUS
1000.0000 mL | Freq: Once | INTRAVENOUS | Status: AC
  Administered 2017-10-29: 1000 mL via INTRAVENOUS

## 2017-10-29 MED ORDER — TRAMADOL HCL 50 MG PO TABS: 50.0000 mg | ORAL_TABLET | Freq: Four times a day (QID) | ORAL | 0 refills | Status: DC | PRN

## 2017-10-29 MED ORDER — ONDANSETRON HCL 4 MG PO TABS: 4.0000 mg | ORAL_TABLET | Freq: Three times a day (TID) | ORAL | 0 refills | Status: DC | PRN

## 2017-10-29 MED ORDER — IBUPROFEN 600 MG PO TABS: 600.0000 mg | ORAL_TABLET | Freq: Four times a day (QID) | ORAL | 0 refills | Status: DC | PRN

## 2017-10-29 MED ORDER — FENTANYL CITRATE (PF) 100 MCG/2ML IJ SOLN
50.0000 ug | Freq: Once | INTRAMUSCULAR | Status: AC
  Administered 2017-10-29: 50 ug via INTRAVENOUS
  Filled 2017-10-29: qty 2

## 2017-10-29 MED ORDER — SODIUM CHLORIDE 0.9 % IV SOLN
1.0000 g | Freq: Once | INTRAVENOUS | Status: AC
  Administered 2017-10-29: 1 g via INTRAVENOUS
  Filled 2017-10-29: qty 10

## 2017-10-29 MED ORDER — CEPHALEXIN 500 MG PO CAPS: 500.0000 mg | ORAL_CAPSULE | Freq: Four times a day (QID) | ORAL | 0 refills | Status: AC

## 2017-10-29 MED ORDER — MORPHINE SULFATE (PF) 4 MG/ML IV SOLN
4.0000 mg | Freq: Once | INTRAVENOUS | Status: AC
  Administered 2017-10-29: 4 mg via INTRAVENOUS
  Filled 2017-10-29: qty 1

## 2017-10-29 MED ORDER — KETOROLAC TROMETHAMINE 30 MG/ML IJ SOLN
30.0000 mg | Freq: Once | INTRAMUSCULAR | Status: AC
  Administered 2017-10-29: 30 mg via INTRAVENOUS
  Filled 2017-10-29: qty 1

## 2017-10-29 MED ORDER — MORPHINE SULFATE (PF) 2 MG/ML IV SOLN
2.0000 mg | Freq: Once | INTRAVENOUS | Status: AC
Start: 2017-10-29 — End: 2017-10-29
  Administered 2017-10-29: 2 mg via INTRAVENOUS
  Filled 2017-10-29: qty 1

## 2017-10-29 MED ORDER — CEPHALEXIN 500 MG PO CAPS: 500.0000 mg | ORAL_CAPSULE | Freq: Four times a day (QID) | ORAL | 0 refills | Status: DC

## 2017-10-29 NOTE — ED Triage Notes (Signed)
Right sided flank pain radiates to RLQ--started 1 hr ago. EMS administered 500 cc of NS, 150 mcg of Fentanyl and 4 mg of Zofran IV. VSS with EMS. Hx of kidney stones.

## 2017-10-29 NOTE — Discharge Instructions (Addendum)
Alternate 600 mg of ibuprofen and [id number] mg of Tylenol every 3 hours as needed for pain. Do not exceed 4000 mg of Tylenol daily.  You may take tramadol for severe pain but do not drive, drink alcohol, or operate heavy machinery while taking this medicine as it may make you drowsy.  Drink plenty of water and get plenty of rest.  You may take Zofran as needed for nausea, weight around 20-30 minutes before having something to eat or drink to give the medication time to work.  Please take all of your antibiotics until finished!   You may develop abdominal discomfort or diarrhea from the antibiotic.  You may help offset this with probiotics which you can buy or get in yogurt. Do not eat  or take the probiotics until 2 hours after your antibiotic.   Call the urologist tomorrow to make an appointment for follow-up for the next 24-48 hours.  Return to the emergency department immediately for any concerning signs or symptoms develop such as fever (temperature greater than 100.4 f), persistent vomiting, worsening pain, or weakness.

## 2017-10-29 NOTE — ED Notes (Signed)
Bed: [facility] Expected date:  Expected time:  Means of arrival:  Comments: 

## 2017-10-29 NOTE — ED Notes (Addendum)
Pt ambulated to restroom without assistance but did get light-headed

## 2017-10-29 NOTE — ED Provider Notes (Signed)
[facility] refer to provider note for full history and physical examination.  Briefly, patient is a 32 year old female with history of nephrolithiasis who presents for sudden onset right flank pain.  She has nausea but no vomiting.  She has a leukocytosis, normal kidney function.  Awaiting CT renal stone study and UA.  Distal pending results however anticipate discharge home with pain control and nausea medicine.  Physical Exam  BP 113/79 (BP Location: Right Arm)   Pulse 80   Temp 98.2 F (36.8 C) (Oral)   Resp 18   SpO2 100%   Physical Exam  Constitutional: She appears well-developed and well-nourished. No distress.  HENT:  Head: Normocephalic and atraumatic.  Eyes: Conjunctivae are normal. Right eye exhibits no discharge. Left eye exhibits no discharge.  Neck: No JVD present. No tracheal deviation present.  Cardiovascular: Normal rate.  Pulmonary/Chest: Effort normal.  Abdominal: Soft. Bowel sounds are normal. She exhibits no distension. There is no tenderness. There is no guarding.  Mild right CVA tenderness  Musculoskeletal: She exhibits no edema.  No midline spine TTP, right paralumbar muscle tenderness, no deformity, crepitus, or step-off noted   Neurological: She is alert.  Skin: Skin is warm. No erythema.  Psychiatric: She has a normal mood and affect. Her behavior is normal.  Nursing note and vitals reviewed.    MDM  UA is concerning for possible infected kidney stone with too numerous to count WBCs and many bacteria.  CT renal stone study shows a right 6 mm UVJ stone with moderate right hydronephrosis.  No evidence of abscess.  Patient is resting comfortably, states she is still having 7/10 severity of pain.  Will give Toradol, second fluid bolus, and more pain medicine.  She states her nausea is under control. 5:25 PM Spoke with Dr. Mena GoesEskridge with urology who states that patient is stable for discharge home after IV antibiotics.  He  recommends discharge with p.o. antibiotics.  He states that the patient should call the urology office tomorrow to set up follow-up appointment.   Patient is resting comfortably in bed.  States her pain has improved.  Received Rocephin in the ED. tolerating p.o. food and fluids in the ED without difficulty.  She is ambulatory without difficulty exhibits a good gait and balance although she did states she was lightheaded.  No syncope.  Serial abdominal examinations remained benign.  No fever while in the ED.  Will discharge on Keflex.  Will provide pain medicine, Zofran, and ibuprofen.  She understands to call the urology office tomorrow to set up follow-up appointment.  Discussed strict ED return precautions. Pt verbalized understanding of and agreement with plan and is safe for discharge home at this time.      Bennye AlmFawze, Anastazia [place] A, PA-C 10/29/17 2120    Shaune PollackIsaacs, Cameron, MD 10/30/17 1850

## 2017-10-29 NOTE — ED Provider Notes (Signed)
[facility]-EMERGENCY DEPT Provider Note   CSN: [id number] Arrival date & time: 10/29/17  1352     History   Chief Complaint Chief Complaint  Patient presents with  . Flank Pain    HPI [place] Roughton is a 32 y.o. female with a past medical history of nephrolithiasis, who presents to ED for evaluation of a 1 hour history of right-sided flank pain radiating to right lower quadrant.  She reports associated nausea.  Symptoms began suddenly while at work.  Reports improvement in pain with pain medication given in EMS and improvement in nausea with Zofran.  Patient has a history of kidney stones with her most recent one being 2 years ago for which she needed stent placement.  States that this feels similar to prior kidney stones.  Denies any fevers, dysuria, hematuria, vomiting, changes in bowel movements, vaginal complaints.  HPI  Past Medical History:  Diagnosis Date  . Anemia    during pregnancy  . Arthritis   . Kidney stones    had multiple stones during pregnancy, followed by [facility]    Patient Active Problem List   Diagnosis Date Noted  . Ureteral stone 06/03/2015    Past Surgical History:  Procedure Laterality Date  . CYSTOSCOPY WITH RETROGRADE PYELOGRAM, URETEROSCOPY AND STENT PLACEMENT Right 06/04/2015   Procedure: CYSTOSCOPY WITH  RIGHT RETROGRADE PYELOGRAM,RIGHT URETEROSCOPY , LASER LITHOTRIPSY AND BASKET EXTRACTION OF STONE;  Surgeon: Marcine MatarStephen Dahlstedt, MD;  Location: [facility];  Service: Urology;  Laterality: Right;  . HOLMIUM LASER APPLICATION Right 06/04/2015   Procedure: HOLMIUM LASER APPLICATION;  Surgeon: Marcine MatarStephen Dahlstedt, MD;  Location: [facility];  Service: Urology;  Laterality: Right;  . STONE EXTRACTION WITH BASKET Right 06/04/2015   Procedure: STONE EXTRACTION WITH BASKET;  Surgeon: Marcine MatarStephen Dahlstedt, MD;  Location: [facility];  Service: Urology;  Laterality: Right;  . TUBAL LIGATION       OB History   None      Home Medications     Prior to Admission medications   Medication Sig Start Date End Date Taking? Authorizing Provider  metroNIDAZOLE (METROGEL) 0.75 % vaginal gel Place 1 Applicatorful vaginally at bedtime. 1 applicator at night for 5 days 01/07/16   Terri PiedraForcucci, Courtney, PA-C    Family History Family History  Problem Relation Age of Onset  . Hypertension Mother   . Arthritis Father   . Kawasaki disease Son     Social History Social History   Tobacco Use  . Smoking status: Never Smoker  Substance Use Topics  . Alcohol use: Yes  . Drug use: No     Allergies   Patient has no known allergies.   Review of Systems Review of Systems  Constitutional: Negative for appetite change, chills and fever.  HENT: Negative for ear pain, rhinorrhea, sneezing and sore throat.   Eyes: Negative for photophobia and visual disturbance.  Respiratory: Negative for cough, chest tightness, shortness of breath and wheezing.   Cardiovascular: Negative for chest pain and palpitations.  Gastrointestinal: Positive for nausea. Negative for abdominal pain, blood in stool, constipation, diarrhea and vomiting.  Genitourinary: Positive for flank pain. Negative for dysuria, hematuria and urgency.  Musculoskeletal: Negative for myalgias.  Skin: Negative for rash.  Neurological: Negative for dizziness, weakness and light-headedness.     Physical Exam Updated Vital Signs BP 103/70   Pulse 76   Temp 98.2 F (36.8 C) (Oral)   Resp 18   SpO2 97%   Physical Exam  Constitutional: She appears  well-developed and well-nourished. No distress.  Resting comfortably. NAD.  HENT:  Head: Normocephalic and atraumatic.  Nose: Nose normal.  Eyes: Conjunctivae and EOM are normal. Left eye exhibits no discharge. No scleral icterus.  Neck: Normal range of motion. Neck supple.  Cardiovascular: Normal rate, regular rhythm, normal heart sounds and intact distal pulses. Exam reveals no gallop and no friction rub.  No murmur  heard. Pulmonary/Chest: Effort normal and breath sounds normal. No respiratory distress.  Abdominal: Soft. Bowel sounds are normal. She exhibits no distension. There is tenderness (R flank). There is no guarding.  Musculoskeletal: Normal range of motion. She exhibits no edema.  Neurological: She is alert. She exhibits normal muscle tone. Coordination normal.  Skin: Skin is warm and dry. No rash noted.  Psychiatric: She has a normal mood and affect.  Nursing note and vitals reviewed.    ED Treatments / Results  Labs (all labs ordered are listed, but only abnormal results are displayed) Labs Reviewed  CBC WITH DIFFERENTIAL/PLATELET - Abnormal; Notable for the following components:      Result Value   WBC 12.8 (*)    Hemoglobin 11.2 (*)    HCT 34.9 (*)    Neutro Abs 10.2 (*)    All other components within normal limits  PREGNANCY, URINE  URINALYSIS, ROUTINE W REFLEX MICROSCOPIC  BASIC METABOLIC PANEL    EKG None  Radiology No results found.  Procedures Procedures (including critical care time)  Medications Ordered in ED Medications  sodium chloride 0.9 % bolus 1,000 mL (1,000 mLs Intravenous New Bag/Given 10/29/17 1440)     Initial Impression / Assessment and Plan / ED Course  I have reviewed the triage vital signs and the nursing notes.  Pertinent labs & imaging results that were available during my care of the patient were reviewed by me and considered in my medical decision making (see chart for details).     Patient presents to ED for evaluation of R sided flank pain for the past hour prior to arrival.  A history of kidney stones with the most recent one being 2 years ago for which she needed stent placement.  States that this feels similar.  Reports associated nausea.  Denies any fevers, vomiting, changes in bowel movements, injuries or falls.  On physical exam she has right-sided flank tenderness to palpation.  She is afebrile.  She reports improvement in her pain  with fentanyl and improvement in her nausea with Zofran given in EMS.  Will obtain lab work, urinalysis, urine pregnancy and evaluate for stone with CT renal stone study. Care signed out to oncoming provider, Ardine Eng, PA-C pending work up. Will dispo accordingly.  Portions of this note were generated with Scientist, clinical (histocompatibility and immunogenetics). Dictation errors may occur despite best attempts at proofreading.   Final Clinical Impressions(s) / ED Diagnoses   Final diagnoses:  None    ED Discharge Orders    None       Dietrich Pates, PA-C 10/29/17 1602    Abelino Derrick, MD 10/30/17 1557

## 2017-10-29 NOTE — ED Notes (Signed)
Pt given sprite to drink. 

## 2017-10-31 LAB — URINE CULTURE

## 2017-11-10 ENCOUNTER — Other Ambulatory Visit: Payer: Self-pay | Admitting: Urology

## 2017-11-16 NOTE — Progress Notes (Signed)
10/29/2017- noted in Epic-LABS- I stat Lactic Acid, Beta HCG, BMP, CBC w/ diff., U/A and Urine culture

## 2017-11-16 NOTE — Patient Instructions (Addendum)
Audrey Gonzalez  11/16/2017   Your procedure is scheduled on: Friday 11/24/2017    Report to [facility] Main  Entrance                Report to admitting at  0930  AM    Call this number if you have problems the morning of surgery [phone]    Remember: Do not eat food or drink liquids :After Midnight.     Take these medicines the morning of surgery with A SIP OF WATER: take Oxycodone-Acetaminophen if needed for pain, CEPHALEXIN                                 You may not have any metal on your body including hair pins and              piercings  Do not wear jewelry, make-up, lotions, powders or perfumes, deodorant             Do not wear nail polish.  Do not shave  48 hours prior to surgery.           Do not bring valuables to the hospital. [facility] IS NOT             RESPONSIBLE   FOR VALUABLES.  Contacts, dentures or bridgework may not be worn into surgery.  Leave suitcase in the car. After surgery it may be brought to your room.     Patients discharged the day of surgery will not be allowed to drive home.  Name and phone number of your driver:                Please read over the following fact sheets you were given: _____________________________________________________________________             [facility] - Preparing for Surgery Before surgery, you can play an important role.  Because skin is not sterile, your skin needs to be as free of germs as possible.  You can reduce the number of germs on your skin by washing with CHG (chlorahexidine gluconate) soap before surgery.  CHG is an antiseptic cleaner which kills germs and bonds with the skin to continue killing germs even after washing. Please DO NOT use if you have an allergy to CHG or antibacterial soaps.  If your skin becomes reddened/irritated stop using the CHG and inform your nurse when you arrive at Short Stay. Do not shave (including legs and underarms) for at least 48 hours prior  to the first CHG shower.  You may shave your face/neck. Please follow these instructions carefully:  1.  Shower with CHG Soap the night before surgery and the  morning of Surgery.  2.  If you choose to wash your hair, wash your hair first as usual with your  normal  shampoo.  3.  After you shampoo, rinse your hair and body thoroughly to remove the  shampoo.                           4.  Use CHG as you would any other liquid soap.  You can apply chg directly  to the skin and wash  Gently with a scrungie or clean washcloth.  5.  Apply the CHG Soap to your body ONLY FROM THE NECK DOWN.   Do not use on face/ open                           Wound or open sores. Avoid contact with eyes, ears mouth and genitals (private parts).                       Wash face,  Genitals (private parts) with your normal soap.             6.  Wash thoroughly, paying special attention to the area where your surgery  will be performed.  7.  Thoroughly rinse your body with warm water from the neck down.  8.  DO NOT shower/wash with your normal soap after using and rinsing off  the CHG Soap.                9.  Pat yourself dry with a clean towel.            10.  Wear clean pajamas.            11.  Place clean sheets on your bed the night of your first shower and do not  sleep with pets. Day of Surgery : Do not apply any lotions/deodorants the morning of surgery.  Please wear clean clothes to the hospital/surgery center.  FAILURE TO FOLLOW THESE INSTRUCTIONS MAY RESULT IN THE CANCELLATION OF YOUR SURGERY PATIENT SIGNATURE_________________________________  NURSE SIGNATURE__________________________________  ________________________________________________________________________

## 2017-11-21 ENCOUNTER — Encounter (HOSPITAL_COMMUNITY)
Admission: RE | Admit: 2017-11-21 | Discharge: 2017-11-21 | Disposition: A | Discharge status: Home / Self Care | Payer: BLUE CROSS/BLUE SHIELD | Source: Ambulatory Visit | Attending: Urology | Admitting: Urology

## 2017-11-21 ENCOUNTER — Other Ambulatory Visit: Payer: Self-pay

## 2017-11-21 ENCOUNTER — Encounter (HOSPITAL_COMMUNITY): Payer: Self-pay

## 2017-11-21 DIAGNOSIS — Z01812 Encounter for preprocedural laboratory examination: Secondary | ICD-10-CM | POA: Insufficient documentation

## 2017-11-21 DIAGNOSIS — N200 Calculus of kidney: Secondary | ICD-10-CM | POA: Diagnosis not present

## 2017-11-21 HISTORY — DX: Migraine, unspecified, not intractable, without status migrainosus: G43.909

## 2017-11-21 HISTORY — DX: Personal history of urinary calculi: Z87.442

## 2017-11-21 HISTORY — DX: Other specified soft tissue disorders: M79.89

## 2017-11-21 LAB — CBC
HEMATOCRIT: 39.6 % (ref 36.0–46.0)
HEMOGLOBIN: 12.6 g/dL (ref 12.0–15.0)
MCH: 26.7 pg (ref 26.0–34.0)
MCHC: 31.8 g/dL (ref 30.0–36.0)
MCV: 83.9 fL (ref 78.0–100.0)
Platelets: 237 10*3/uL (ref 150–400)
RBC: 4.72 MIL/uL (ref 3.87–5.11)
RDW: 14.3 % (ref 11.5–15.5)
WBC: 5 10*3/uL (ref 4.0–10.5)

## 2017-11-21 LAB — HCG, SERUM, QUALITATIVE: PREG SERUM: NEGATIVE

## 2017-11-24 ENCOUNTER — Observation Stay (HOSPITAL_COMMUNITY)
Admission: RE | Admit: 2017-11-24 | Discharge: 2017-11-25 | Disposition: A | Discharge status: Home / Self Care | Payer: BLUE CROSS/BLUE SHIELD | Source: Ambulatory Visit | Attending: Urology | Admitting: Urology

## 2017-11-24 ENCOUNTER — Encounter (HOSPITAL_COMMUNITY)
Admission: RE | Disposition: A | Discharge status: Home / Self Care | Payer: Self-pay | Source: Ambulatory Visit | Attending: Urology

## 2017-11-24 ENCOUNTER — Other Ambulatory Visit: Payer: Self-pay

## 2017-11-24 ENCOUNTER — Ambulatory Visit (HOSPITAL_COMMUNITY): Payer: BLUE CROSS/BLUE SHIELD

## 2017-11-24 ENCOUNTER — Encounter (HOSPITAL_COMMUNITY): Payer: Self-pay | Admitting: *Deleted

## 2017-11-24 ENCOUNTER — Ambulatory Visit (HOSPITAL_COMMUNITY): Payer: BLUE CROSS/BLUE SHIELD | Admitting: Certified Registered"

## 2017-11-24 DIAGNOSIS — M19042 Primary osteoarthritis, left hand: Secondary | ICD-10-CM | POA: Diagnosis not present

## 2017-11-24 DIAGNOSIS — N134 Hydroureter: Secondary | ICD-10-CM | POA: Insufficient documentation

## 2017-11-24 DIAGNOSIS — Z79899 Other long term (current) drug therapy: Secondary | ICD-10-CM | POA: Diagnosis not present

## 2017-11-24 DIAGNOSIS — Z87442 Personal history of urinary calculi: Secondary | ICD-10-CM | POA: Insufficient documentation

## 2017-11-24 DIAGNOSIS — M19041 Primary osteoarthritis, right hand: Secondary | ICD-10-CM | POA: Diagnosis not present

## 2017-11-24 DIAGNOSIS — N200 Calculus of kidney: Secondary | ICD-10-CM

## 2017-11-24 DIAGNOSIS — Z79891 Long term (current) use of opiate analgesic: Secondary | ICD-10-CM | POA: Insufficient documentation

## 2017-11-24 DIAGNOSIS — N201 Calculus of ureter: Secondary | ICD-10-CM | POA: Diagnosis present

## 2017-11-24 HISTORY — PX: CYSTOSCOPY WITH RETROGRADE PYELOGRAM, URETEROSCOPY AND STENT PLACEMENT: SHX5789

## 2017-11-24 LAB — CREATININE, SERUM
Creatinine, Ser: 0.67 mg/dL (ref 0.44–1.00)
GFR calc Af Amer: >60 mL/min (ref >60)
GFR calc non Af Amer: >60 mL/min (ref >60)

## 2017-11-24 LAB — CBC
HEMATOCRIT: 40.1 % (ref 36.0–46.0)
HEMOGLOBIN: 12.8 g/dL (ref 12.0–15.0)
MCH: 26.2 pg (ref 26.0–34.0)
MCHC: 31.9 g/dL (ref 30.0–36.0)
MCV: 82.2 fL (ref 78.0–100.0)
Platelets: 246 10*3/uL (ref 150–400)
RBC: 4.88 MIL/uL (ref 3.87–5.11)
RDW: 14.2 % (ref 11.5–15.5)
WBC: 11.7 10*3/uL — ABNORMAL HIGH (ref 4.0–10.5)

## 2017-11-24 SURGERY — CYSTOURETEROSCOPY, WITH RETROGRADE PYELOGRAM AND STENT INSERTION
Anesthesia: General | Laterality: Right

## 2017-11-24 MED ORDER — HYDROMORPHONE HCL 1 MG/ML IJ SOLN
0.2500 mg | INTRAMUSCULAR | Status: DC | PRN
  Administered 2017-11-24: 0.25 mg via INTRAVENOUS

## 2017-11-24 MED ORDER — KETOROLAC TROMETHAMINE 30 MG/ML IJ SOLN
30.0000 mg | Freq: Once | INTRAMUSCULAR | Status: AC
  Administered 2017-11-24: 30 mg via INTRAVENOUS

## 2017-11-24 MED ORDER — SCOPOLAMINE 1 MG/3DAYS TD PT72
1.0000 | MEDICATED_PATCH | TRANSDERMAL | Status: DC
  Administered 2017-11-24: 1.5 mg via TRANSDERMAL

## 2017-11-24 MED ORDER — ONDANSETRON HCL 4 MG/2ML IJ SOLN
INTRAMUSCULAR | Status: DC | PRN
  Administered 2017-11-24: 4 mg via INTRAVENOUS

## 2017-11-24 MED ORDER — OXYCODONE HCL 5 MG PO TABS
5.0000 mg | ORAL_TABLET | ORAL | Status: DC | PRN
  Administered 2017-11-24 – 2017-11-25 (×3): 5 mg via ORAL
  Filled 2017-11-24 (×4): qty 1

## 2017-11-24 MED ORDER — LIDOCAINE 2% (20 MG/ML) 5 ML SYRINGE
INTRAMUSCULAR | Status: DC | PRN
  Administered 2017-11-24: 60 mg via INTRAVENOUS

## 2017-11-24 MED ORDER — SCOPOLAMINE 1 MG/3DAYS TD PT72
MEDICATED_PATCH | TRANSDERMAL | Status: AC
  Filled 2017-11-24: qty 1

## 2017-11-24 MED ORDER — CEPHALEXIN 500 MG PO CAPS
500.0000 mg | ORAL_CAPSULE | Freq: Two times a day (BID) | ORAL | Status: DC
  Administered 2017-11-24 – 2017-11-25 (×2): 500 mg via ORAL
  Filled 2017-11-24 (×2): qty 1

## 2017-11-24 MED ORDER — FENTANYL CITRATE (PF) 250 MCG/5ML IJ SOLN
INTRAMUSCULAR | Status: DC | PRN
  Administered 2017-11-24 (×2): 50 ug via INTRAVENOUS

## 2017-11-24 MED ORDER — ENOXAPARIN SODIUM 40 MG/0.4ML ~~LOC~~ SOLN
40.0000 mg | Freq: Every day | SUBCUTANEOUS | Status: DC
  Administered 2017-11-24: 40 mg via SUBCUTANEOUS
  Filled 2017-11-24: qty 0.4

## 2017-11-24 MED ORDER — FENTANYL CITRATE (PF) 250 MCG/5ML IJ SOLN
INTRAMUSCULAR | Status: AC
  Filled 2017-11-24: qty 5

## 2017-11-24 MED ORDER — MIDAZOLAM HCL 2 MG/2ML IJ SOLN
INTRAMUSCULAR | Status: AC
  Filled 2017-11-24: qty 2

## 2017-11-24 MED ORDER — PROMETHAZINE HCL 25 MG/ML IJ SOLN: 6.2500 mg | INTRAMUSCULAR | Status: DC | PRN

## 2017-11-24 MED ORDER — OXYCODONE HCL 5 MG/5ML PO SOLN
5.0000 mg | Freq: Once | ORAL | Status: AC | PRN
  Administered 2017-11-24: 5 mg via ORAL
  Filled 2017-11-24 (×2): qty 5

## 2017-11-24 MED ORDER — PROPOFOL 10 MG/ML IV BOLUS
INTRAVENOUS | Status: DC | PRN
  Administered 2017-11-24: 200 mg via INTRAVENOUS

## 2017-11-24 MED ORDER — DEXAMETHASONE SODIUM PHOSPHATE 10 MG/ML IJ SOLN
INTRAMUSCULAR | Status: DC | PRN
  Administered 2017-11-24: 10 mg via INTRAVENOUS

## 2017-11-24 MED ORDER — CEFAZOLIN SODIUM-DEXTROSE 2-4 GM/100ML-% IV SOLN
2.0000 g | INTRAVENOUS | Status: AC
  Administered 2017-11-24: 2 g via INTRAVENOUS
  Filled 2017-11-24: qty 100

## 2017-11-24 MED ORDER — KETOROLAC TROMETHAMINE 30 MG/ML IJ SOLN
INTRAMUSCULAR | Status: AC
  Filled 2017-11-24: qty 1

## 2017-11-24 MED ORDER — OXYBUTYNIN CHLORIDE 5 MG PO TABS: 5.0000 mg | ORAL_TABLET | Freq: Three times a day (TID) | ORAL | 1 refills | Status: DC | PRN

## 2017-11-24 MED ORDER — HYDROMORPHONE HCL 1 MG/ML IJ SOLN
INTRAMUSCULAR | Status: AC
  Filled 2017-11-24: qty 1

## 2017-11-24 MED ORDER — OXYCODONE HCL 5 MG PO TABS
ORAL_TABLET | ORAL | Status: AC
  Filled 2017-11-24: qty 1

## 2017-11-24 MED ORDER — PROPOFOL 10 MG/ML IV BOLUS
INTRAVENOUS | Status: AC
  Filled 2017-11-24: qty 20

## 2017-11-24 MED ORDER — OXYCODONE HCL 5 MG PO TABS: 5.0000 mg | ORAL_TABLET | Freq: Once | ORAL | Status: AC | PRN

## 2017-11-24 MED ORDER — MIDAZOLAM HCL 2 MG/2ML IJ SOLN
INTRAMUSCULAR | Status: DC | PRN
  Administered 2017-11-24: 2 mg via INTRAVENOUS

## 2017-11-24 MED ORDER — SODIUM CHLORIDE 0.9 % IR SOLN
Status: DC | PRN
  Administered 2017-11-24: 3000 mL via INTRAVESICAL

## 2017-11-24 MED ORDER — LACTATED RINGERS IV SOLN
INTRAVENOUS | Status: DC
  Administered 2017-11-24: 10:00:00 via INTRAVENOUS

## 2017-11-24 MED ORDER — HYDROMORPHONE HCL 1 MG/ML IJ SOLN: 0.2500 mg | INTRAMUSCULAR | Status: DC | PRN

## 2017-11-24 MED ORDER — OXYBUTYNIN CHLORIDE 5 MG PO TABS
5.0000 mg | ORAL_TABLET | Freq: Three times a day (TID) | ORAL | Status: DC | PRN
  Administered 2017-11-25: 5 mg via ORAL
  Filled 2017-11-24: qty 1

## 2017-11-24 MED ORDER — SODIUM CHLORIDE 0.45 % IV SOLN
INTRAVENOUS | Status: DC
  Administered 2017-11-24: 17:00:00 via INTRAVENOUS

## 2017-11-24 MED ORDER — CEPHALEXIN 500 MG PO CAPS: 500.0000 mg | ORAL_CAPSULE | Freq: Two times a day (BID) | ORAL | 0 refills | Status: DC

## 2017-11-24 MED ORDER — IOHEXOL 300 MG/ML  SOLN
INTRAMUSCULAR | Status: DC | PRN
  Administered 2017-11-24: 4 mL via URETHRAL

## 2017-11-24 SURGICAL SUPPLY — 26 items
BAG URO CATCHER STRL LF (MISCELLANEOUS) ×3 IMPLANT
BASKET LASER NITINOL 1.9FR (BASKET) ×3 IMPLANT
BASKET ZERO TIP NITINOL 2.4FR (BASKET) IMPLANT
BSKT STON RTRVL 120 1.9FR (BASKET) ×1
BSKT STON RTRVL ZERO TP 2.4FR (BASKET)
CATH INTERMIT  6FR 70CM (CATHETERS) ×3 IMPLANT
CLOTH BEACON ORANGE TIMEOUT ST (SAFETY) IMPLANT
COVER FOOTSWITCH UNIV (MISCELLANEOUS) IMPLANT
COVER SURGICAL LIGHT HANDLE (MISCELLANEOUS) ×3 IMPLANT
FIBER LASER FLEXIVA 365 (UROLOGICAL SUPPLIES) IMPLANT
FIBER LASER TRAC TIP (UROLOGICAL SUPPLIES) IMPLANT
GLOVE BIOGEL M 8.0 STRL (GLOVE) ×9 IMPLANT
GOWN STRL REUS W/ TWL XL LVL3 (GOWN DISPOSABLE) IMPLANT
GOWN STRL REUS W/TWL LRG LVL3 (GOWN DISPOSABLE) ×6 IMPLANT
GOWN STRL REUS W/TWL XL LVL3 (GOWN DISPOSABLE)
GUIDEWIRE ANG ZIPWIRE 038X150 (WIRE) ×3 IMPLANT
GUIDEWIRE STR DUAL SENSOR (WIRE) ×3 IMPLANT
IV NS 1000ML (IV SOLUTION) ×3
IV NS 1000ML BAXH (IV SOLUTION) ×1 IMPLANT
MANIFOLD NEPTUNE II (INSTRUMENTS) ×3 IMPLANT
PACK CYSTO (CUSTOM PROCEDURE TRAY) ×3 IMPLANT
SHEATH URETERAL 12FRX28CM (UROLOGICAL SUPPLIES) ×3 IMPLANT
STENT URET 6FRX24 CONTOUR (STENTS) ×3 IMPLANT
TUBING CONNECTING 10 (TUBING) ×2 IMPLANT
TUBING CONNECTING 10' (TUBING) ×1
TUBING UROLOGY SET (TUBING) ×3 IMPLANT

## 2017-11-24 NOTE — Progress Notes (Signed)
Notified Dr. Lenn Sink pt is in 9/10 pain after voiding. Per MD will give  IV Toradol. Will continue to monitor closely.

## 2017-11-24 NOTE — Op Note (Signed)
Preoperative diagnosis: Right ureteral stone  Postoperative diagnosis: Right ureteral stone, passed  Principal procedure: Cystoscopy, right retrograde ureteropyelogram, right ureteroscopy, fluoroscopic interpretation, placement of 6 French by 24 cm contour double-J stent with tether  Surgeon: Conner Muegge  Anesthesia: General with LMA  Specimen: None  Drains: None  Estimated blood loss: None  Indications: 32 year old female with recurrent urolithiasis.  She is been treated over the past few weeks for symptomatic right distal ureteral stone.  She was having pain as recently as yesterday.  She presents at this time for right ureteroscopy and possible holmium laser lithotripsy of her ureteral stone.  Risks and complications have been discussed with the patient.  She understands these and desires to proceed.  Findings: Normal bladder, normal ureteral orifice ease.  On retrograde ureteropyelogram on the right, there was moderate right hydroureter with, upon injection contrast, movable filling defect in the right distal/mid ureter consistent with possible stone (I did not think that this was an air bubble).  Description of procedure: The patient was properly identified in the holding area, right side was marked, she received preoperative IV antibiotics.  She was then taken to the operating room where general anesthesia was administered with the LMA.  She was placed in the dorsolithotomy position.  Genitalia and perineum were prepped and draped.  Proper timeout was performed.  22 French panendoscope was advanced into her bladder with an inspection of the entire urothelium revealing normal bladder with normal ureteral orifice ease.  No stones were seen.  The right ureteral orifice was cannulated with a 6 [place] open-ended catheter and retrograde ureteropyelogram performed, with the above-mentioned findings.  Following this, sensor tip guidewire was advanced through the open-ended catheter and up into the  right upper pole calyceal system where a curl was seen.  The cystoscope in the open-ended catheter were removed, the guidewire left in place.  The distal ureter was then dilated first with the obturator, then the entire 12/14 ureteral access catheter.  This was removed with the guidewire left in place.  Semirigid ureteroscope was advanced into the ureter.  No stone was seen from the ureterovesical junction to the ureteropelvic junction.  I then replaced the ureteral access catheter and flexible cystoscopy then performed.  No stones were seen other than to very large lower pole calyceal stones which will be treated, if necessary, at a later date through percutaneous approach.  There being no smaller stone in the pyelocalyceal system which was sequentially inspected, the scope was removed.  The guidewire was replaced through the access catheter, and the guidewire backloaded through the scope.  A 24 cm 6 [place] contour double-J stent was then placed with the tether remaining.  The proximal and distal curls were seen using fluoroscopy and cystoscopic inspection, respectively.  The bladder was drained, the scope was removed after another inspection was carried out revealing no further stone.  The thread was brought to the urethra, tied, trimmed, and placed within the vagina.  At this point, the patient was awakened and taken to the PACU, having tolerated the procedure well.

## 2017-11-24 NOTE — Anesthesia Preprocedure Evaluation (Addendum)
Anesthesia Evaluation  Patient identified by MRN, date of birth, ID band Patient awake    Reviewed: Allergy & Precautions, NPO status , Patient's Chart, lab work & pertinent test results  Airway Mallampati: III  TM Distance: >3 FB Neck ROM: Full    Dental no notable dental hx.    Pulmonary neg pulmonary ROS,    Pulmonary exam normal breath sounds clear to auscultation       Cardiovascular negative cardio ROS Normal cardiovascular exam Rhythm:Regular Rate:Normal     Neuro/Psych  Headaches, negative psych ROS   GI/Hepatic negative GI ROS, Neg liver ROS,   Endo/Other  negative endocrine ROS  Renal/GU Renal disease     Musculoskeletal negative musculoskeletal ROS (+)   Abdominal   Peds  Hematology negative hematology ROS (+)   Anesthesia Other Findings right ureteral calculus  Reproductive/Obstetrics hcg negative                            Anesthesia Physical Anesthesia Plan  ASA: I  Anesthesia Plan: General   Post-op Pain Management:    Induction: Intravenous  PONV Risk Score and Plan: 3 and Midazolam, Dexamethasone, Ondansetron, Treatment may vary due to age or medical condition and Scopolamine patch - Pre-op  Airway Management Planned: LMA  Additional Equipment:   Intra-op Plan:   Post-operative Plan: Extubation in OR  Informed Consent: I have reviewed the patients History and Physical, chart, labs and discussed the procedure including the risks, benefits and alternatives for the proposed anesthesia with the patient or authorized representative who has indicated his/her understanding and acceptance.   Dental advisory given  Plan Discussed with: CRNA  Anesthesia Plan Comments:         Anesthesia Quick Evaluation

## 2017-11-24 NOTE — Discharge Instructions (Signed)
1. You may see some blood in the urine and may have some burning with urination for 48-72 hours. You also may notice that you have to urinate more frequently or urgently after your procedure which is normal.  2. You should call should you develop an inability urinate, fever > 101, persistent nausea and vomiting that prevents you from eating or drinking to stay hydrated.  3. If you have a stent, you will likely urinate more frequently and urgently until the stent is removed and you may experience some discomfort/pain in the lower abdomen and flank especially when urinating. You may take pain medication prescribed to you if needed for pain. You may also intermittently have blood in the urine until the stent is removed. OK to pull string to remove stent on Monday   General Anesthesia, Adult, Care After These instructions provide you with information about caring for yourself after your procedure. Your health care provider may also give you more specific instructions. Your treatment has been planned according to current medical practices, but problems sometimes occur. Call your health care provider if you have any problems or questions after your procedure. What can I expect after the procedure? After the procedure, it is common to have:  Vomiting.  A sore throat.  Mental slowness.  It is common to feel:  Nauseous.  Cold or shivery.  Sleepy.  Tired.  Sore or achy, even in parts of your body where you did not have surgery.  Follow these instructions at home: For at least 24 hours after the procedure:  Do not: ? Participate in activities where you could fall or become injured. ? Drive. ? Use heavy machinery. ? Drink alcohol. ? Take sleeping pills or medicines that cause drowsiness. ? Make important decisions or sign legal documents. ? Take care of children on your own.  Rest. Eating and drinking  If you vomit, drink water, juice, or soup when you can drink without  vomiting.  Drink enough fluid to keep your urine clear or pale yellow.  Make sure you have little or no nausea before eating solid foods.  Follow the diet recommended by your health care provider. General instructions  Have a responsible adult stay with you until you are awake and alert.  Return to your normal activities as told by your health care provider. Ask your health care provider what activities are safe for you.  Take over-the-counter and prescription medicines only as told by your health care provider.  If you smoke, do not smoke without supervision.  Keep all follow-up visits as told by your health care provider. This is important. Contact a health care provider if:  You continue to have nausea or vomiting at home, and medicines are not helpful.  You cannot drink fluids or start eating again.  You cannot urinate after 8-12 hours.  You develop a skin rash.  You have fever.  You have increasing redness at the site of your procedure. Get help right away if:  You have difficulty breathing.  You have chest pain.  You have unexpected bleeding.  You feel that you are having a life-threatening or urgent problem. This information is not intended to replace advice given to you by your health care provider. Make sure you discuss any questions you have with your health care provider. Document Released: 10/10/2000 Document Revised: 12/07/2015 Document Reviewed: 06/18/2015 Elsevier Interactive Patient Education  2018 [employer].   Cystoscopy, Care After Refer to this sheet in the next few weeks. These instructions  provide you with information about caring for yourself after your procedure. Your health care provider may also give you more specific instructions. Your treatment has been planned according to current medical practices, but problems sometimes occur. Call your health care provider if you have any problems or questions after your procedure. What can I expect  after the procedure? After the procedure, it is common to have:  Mild pain when you urinate. Pain should stop within a few minutes after you urinate. This may last for up to 1 week.  A small amount of blood in your urine for several days.  Feeling like you need to urinate but producing only a small amount of urine.  Follow these instructions at home:  Medicines  Take over-the-counter and prescription medicines only as told by your health care provider.  If you were prescribed an antibiotic medicine, take it as told by your health care provider. Do not stop taking the antibiotic even if you start to feel better. General instructions   Return to your normal activities as told by your health care provider. Ask your health care provider what activities are safe for you.  Do not drive for 24 hours if you received a sedative.  Watch for any blood in your urine. If the amount of blood in your urine increases, call your health care provider.  Follow instructions from your health care provider about eating or drinking restrictions.  If a tissue sample was removed for testing (biopsy) during your procedure, it is your responsibility to get your test results. Ask your health care provider or the department performing the test when your results will be ready.  Drink enough fluid to keep your urine clear or pale yellow.  Keep all follow-up visits as told by your health care provider. This is important. Contact a health care provider if:  You have pain that gets worse or does not get better with medicine, especially pain when you urinate.  You have difficulty urinating. Get help right away if:  You have more blood in your urine.  You have blood clots in your urine.  You have abdominal pain.  You have a fever or chills.  You are unable to urinate. This information is not intended to replace advice given to you by your health care provider. Make sure you discuss any questions you have  with your health care provider. Document Released: 01/21/2005 Document Revised: 12/10/2015 Document Reviewed: 05/21/2015 Elsevier Interactive Patient Education  [employer].

## 2017-11-24 NOTE — Transfer of Care (Signed)
Immediate Anesthesia Transfer of Care Note  Patient: Audrey Gonzalez  Procedure(s) Performed: CYSTOSCOPY WITH RIGHT  RETROGRADE RIGHT URETEROSCOPY, RIGHT STENT PLACEMENT (Right )  Patient Location: PACU  Anesthesia Type:General  Level of Consciousness: awake, alert  and patient cooperative  Airway & Oxygen Therapy: Patient connected to face mask oxygen  Post-op Assessment: Report given to RN, Post -op Vital signs reviewed and stable and Patient moving all extremities X 4  Post vital signs: stable  Last Vitals:  Vitals Value Taken Time  BP 108/72 11/24/2017 12:45 PM  Temp    Pulse 76 11/24/2017 12:46 PM  Resp 12 11/24/2017 12:46 PM  SpO2 100 % 11/24/2017 12:46 PM  Vitals shown include unvalidated device data.  Last Pain:  Vitals:   11/24/17 0900  TempSrc: Oral      Patients Stated Pain Goal: 3 (11/24/17 0955)  Complications: No apparent anesthesia complications

## 2017-11-24 NOTE — Anesthesia Postprocedure Evaluation (Signed)
Anesthesia Post Note  Patient: Audrey Gonzalez  Procedure(s) Performed: CYSTOSCOPY WITH RIGHT  RETROGRADE RIGHT URETEROSCOPY, RIGHT STENT PLACEMENT (Right )     Patient location during evaluation: PACU Anesthesia Type: General Level of consciousness: awake and alert Pain management: pain level controlled Vital Signs Assessment: post-procedure vital signs reviewed and stable Respiratory status: spontaneous breathing, nonlabored ventilation, respiratory function stable and patient connected to nasal cannula oxygen Cardiovascular status: blood pressure returned to baseline and stable Postop Assessment: no apparent nausea or vomiting Anesthetic complications: no    Last Vitals:  Vitals:   11/24/17 1345 11/24/17 1357  BP: 113/82 111/81  Pulse: 64 74  Resp: 12 13  Temp:  36.5 C  SpO2: 100% 100%    Last Pain:  Vitals:   11/24/17 1400  TempSrc:   PainSc: 5                  Ryan P Ellender

## 2017-11-24 NOTE — Progress Notes (Signed)
Notified Dr. Retta Diones pt is in 9/10 pain after voiding. Per MD will give  toradol and call back with update.   1515- Notified Dr. Retta Diones patient is still in 7/10 pain after the Toradol. Per MD pt will be admitted due to pain control. Awaiting admitting orders. Will continue to monitor closely.

## 2017-11-24 NOTE — Anesthesia Procedure Notes (Signed)
Procedure Name: LMA Insertion Date/Time: 11/24/2017 11:57 AM Performed by: Donna Bernard, CRNA Pre-anesthesia Checklist: Patient identified, Emergency Drugs available, Suction available, Patient being monitored and Timeout performed Patient Re-evaluated:Patient Re-evaluated prior to induction Oxygen Delivery Method: Circle system utilized Preoxygenation: Pre-oxygenation with 100% oxygen Induction Type: IV induction Ventilation: Mask ventilation without difficulty LMA: LMA flexible inserted LMA Size: 4.0 Number of attempts: 1

## 2017-11-24 NOTE — H&P (Signed)
H&P  Chief Complaint: Kidney stone  History of Present Illness: Audrey Gonzalez is a 32 y.o. year old female presenting for cystoscopy, right retrograde, right ureteroscopy for management of a persistent symptomatic right ureteral stone.  Past Medical History:  Diagnosis Date  . Anemia    during pregnancy  . Arthritis    SEEING RHEUMATOLOGIST TO DETERMINE TREATMENT; PRESENT WITH VISIBLE SWELLING OF SMALL JOINTS IN HANDS   . Bilateral swelling of feet    REPORTS MAY BE DUE TO ARTHRITIS ; CURENTLY SEEING RHEUM TO DETERMINE TREATMENT   . History of kidney stones   . Kidney stones    had multiple stones during pregnancy, followed by [facility]  . Migraines    HAD ONE LAST NIGHT ; MOSTLY OCCURS IF SHE GOES LONG PERIODS WITHOUT EATIING     Past Surgical History:  Procedure Laterality Date  . CYSTOSCOPY WITH RETROGRADE PYELOGRAM, URETEROSCOPY AND STENT PLACEMENT Right 06/04/2015   Procedure: CYSTOSCOPY WITH  RIGHT RETROGRADE PYELOGRAM,RIGHT URETEROSCOPY , LASER LITHOTRIPSY AND BASKET EXTRACTION OF STONE;  Surgeon: Marcine Matar, MD;  Location: [facility];  Service: Urology;  Laterality: Right;  . HOLMIUM LASER APPLICATION Right 06/04/2015   Procedure: HOLMIUM LASER APPLICATION;  Surgeon: Marcine Matar, MD;  Location: [facility];  Service: Urology;  Laterality: Right;  . STONE EXTRACTION WITH BASKET Right 06/04/2015   Procedure: STONE EXTRACTION WITH BASKET;  Surgeon: Marcine Matar, MD;  Location: [facility];  Service: Urology;  Laterality: Right;  . TUBAL LIGATION  2010    Home Medications:  Medications Prior to Admission  Medication Sig Dispense Refill  . cephALEXin (KEFLEX) 500 MG capsule Take 500 mg by mouth 3 (three) times daily. TAKE ONE CAPSULE BY MOUTH TID . BEGIN 3 DAYS BEFORE SCHEDULED PROCEDURE    . ibuprofen (ADVIL,MOTRIN) 600 MG tablet Take 1 tablet (600 mg total) by mouth every 6 (six) hours as needed. 30 tablet 0  . oxyCODONE-acetaminophen (PERCOCET/ROXICET) 5-325 MG  tablet Take 1 tablet by mouth as needed for severe pain.    . metroNIDAZOLE (METROGEL) 0.75 % vaginal gel Place 1 Applicatorful vaginally at bedtime. 1 applicator at night for 5 days (Patient not taking: Reported on 10/29/2017) 70 g 0  . ondansetron (ZOFRAN) 4 MG tablet Take 1 tablet (4 mg total) by mouth every 8 (eight) hours as needed for nausea or vomiting. (Patient not taking: Reported on 11/15/2017) 10 tablet 0  . traMADol (ULTRAM) 50 MG tablet Take 1 tablet (50 mg total) by mouth every 6 (six) hours as needed for severe pain. (Patient not taking: Reported on 11/15/2017) 10 tablet 0    Allergies: No Known Allergies  Family History  Problem Relation Age of Onset  . Hypertension Mother   . Arthritis Father   . Kawasaki disease Son     Social History:  reports that she has never smoked. She has never used smokeless tobacco. She reports that she drank alcohol. She reports that she does not use drugs.  ROS: A complete review of systems was performed.  All systems are negative except for pertinent findings as noted.  Physical Exam:  Vital signs in last 24 hours: Temp:  [98.4 F (36.9 C)] 98.4 F (36.9 C) (05/10 0900) Pulse Rate:  [84] 84 (05/10 0900) Resp:  [16] 16 (05/10 0900) BP: (122)/(91) 122/91 (05/10 0900) SpO2:  [100 %] 100 % (05/10 0900) Weight:  [69.4 kg (153 lb)] 69.4 kg (153 lb) (05/10 0955) General:  Alert and oriented, No acute distress HEENT: Normocephalic,  atraumatic Neck: No JVD or lymphadenopathy Cardiovascular: Regular rate and rhythm Lungs: Clear bilaterally Abdomen: Soft, nontender, nondistended, no abdominal masses Back: No CVA tenderness Extremities: No edema Neurologic: Grossly intact  Laboratory Data:  No results found for this or any previous visit (from the past 24 hour(s)). No results found for this or any previous visit (from the past 240 hour(s)). Creatinine: No results for input(s): CREATININE in the last 168 hours.  Radiologic Imaging: No  results found.  Impression/Assessment:  Right ureteral calculus  Plan:  Anesthetic cystoscopy, right retrograde ureteropyelogram, right ureteroscopy, possible holmium laser lithotripsy/extraction of right ureteral stone.  Audrey Gonzalez 11/24/2017, 11:21 AM  Audrey Millard. Caroleann Casler MD

## 2017-11-25 DIAGNOSIS — N201 Calculus of ureter: Secondary | ICD-10-CM | POA: Diagnosis not present

## 2017-11-25 LAB — HIV ANTIBODY (ROUTINE TESTING W REFLEX): HIV Screen 4th Generation wRfx: NONREACTIVE

## 2017-11-25 NOTE — Progress Notes (Signed)
Patient discharged home, discharge instructions given and explained to patient, she verbalized understanding, denies any pain/distress, skin intact, no wound noted.  Accompanied home by family.

## 2017-11-25 NOTE — Discharge Summary (Signed)
Physician Discharge Summary  Patient ID: Audrey Gonzalez MRN: [id number] DOB/AGE: [birth year] 32 y.o.  Admit date: 11/24/2017 Discharge date: 11/25/2017  Admission Diagnoses:  Discharge Diagnoses:  Active Problems:   Calculus of kidney   Discharged Condition: good  Hospital Course: 32 year old female with a right ureteral calculus underwent a diagnostic right ureteroscopy.  The stone had passed.  Due to pain, the patient was kept overnight for observation.  The following day, she was stable.  She was still having some right-sided flank pain but it was tolerable.  The pain was consistent with stent colic.  Consults: None  Significant Diagnostic Studies: None  Treatments: surgery: Right diagnostic ureteroscopy, ureteral stent placement  Discharge Exam: Blood pressure 113/67, pulse 72, temperature 98 F (36.7 C), temperature source Oral, resp. rate 16, height  (1.676 m), weight 69.5 kg (153 lb 3.5 oz), last menstrual period 11/14/2017, SpO2 99 %. General appearance: alert no acute distress Adequate peripheral perfusion Nonlabored respirations Abdomen soft nontender nondistended  Disposition: Discharge disposition: 01-Home or Self Care       Discharge Instructions    Diet - low sodium heart healthy   Complete by:  As directed    Increase activity slowly   Complete by:  As directed      Allergies as of 11/25/2017   No Known Allergies     Medication List    TAKE these medications   cephALEXin 500 MG capsule Commonly known as:  KEFLEX Take 1 capsule (500 mg total) by mouth 2 (two) times daily. What changed:    when to take this  additional instructions   ibuprofen 600 MG tablet Commonly known as:  ADVIL,MOTRIN Take 1 tablet (600 mg total) by mouth every 6 (six) hours as needed.   metroNIDAZOLE 0.75 % vaginal gel Commonly known as:  METROGEL Place 1 Applicatorful vaginally at bedtime. 1 applicator at night for 5 days   ondansetron 4 MG tablet Commonly  known as:  ZOFRAN Take 1 tablet (4 mg total) by mouth every 8 (eight) hours as needed for nausea or vomiting.   oxybutynin 5 MG tablet Commonly known as:  DITROPAN Take 1 tablet (5 mg total) by mouth every 8 (eight) hours as needed for bladder spasms.   oxyCODONE-acetaminophen 5-325 MG tablet Commonly known as:  PERCOCET/ROXICET Take 1 tablet by mouth as needed for severe pain.   traMADol 50 MG tablet Commonly known as:  ULTRAM Take 1 tablet (50 mg total) by mouth every 6 (six) hours as needed for severe pain.      Follow-up Information    Marcine Matar, MD.   Specialty:  Urology Why:  we'll call you Contact information: [address] [place] [address] [phone]           Signed: Ray Church, III 11/25/2017, 9:33 AM

## 2017-11-25 NOTE — Progress Notes (Signed)
To whom it may concern,  Please excuse Audrey Gonzalez from work from 11/24/2017 until 11/27/17.  If you have any questions please feel free to contact our office at [phone].  Sincerely, Dr. Latrelle Dodrill. Octavia Heir, MD

## 2018-01-30 ENCOUNTER — Other Ambulatory Visit: Payer: Self-pay | Admitting: Urology

## 2018-01-31 ENCOUNTER — Other Ambulatory Visit: Payer: Self-pay | Admitting: Urology

## 2018-01-31 DIAGNOSIS — N200 Calculus of kidney: Secondary | ICD-10-CM

## 2018-02-05 ENCOUNTER — Ambulatory Visit (HOSPITAL_COMMUNITY): Payer: BLUE CROSS/BLUE SHIELD

## 2018-03-01 NOTE — Patient Instructions (Addendum)
Audrey Gonzalez  03/01/2018   Your procedure is scheduled on: 03-08-18   [facility]  Entr[facility]    Report to Admitting at 9:45 AM    Call this number if you have problems the morning of surgery [phone]     Remember: Do not eat food or drink liquids :After Midnight.     Take these medicines the morning of surgery with A SIP OF WATER: None                                 You may not have any metal on your body including hair pins and              piercings  Do not wear jewelry, make-up, lotions, powders or perfumes, deodorant             Do not wear nail polish.  Do not shave  48 hours prior to surgery.                 Do not bring valuables to the hospital. [facility] IS NOT             RESPONSIBLE   FOR VALUABLES.  Contacts, dentures or bridgework may not be worn into surgery.  Leave suitcase in the car. After surgery it may be brought to your room.    Special Instructions: N/A              Please read over the following fact sheets you were given: _____________________________________________________________________          [facility] - Preparing for Surgery Before surgery, you can play an important role.  Because skin is not sterile, your skin needs to be as free of germs as possible.  You can reduce the number of germs on your skin by washing with CHG (chlorahexidine gluconate) soap before surgery.  CHG is an antiseptic cleaner which kills germs and bonds with the skin to continue killing germs even after washing. Please DO NOT use if you have an allergy to CHG or antibacterial soaps.  If your skin becomes reddened/irritated stop using the CHG and inform your nurse when you arrive at Short Stay. Do not shave (including legs and underarms) for at least 48 hours prior to the first CHG shower.  You may shave your face/neck. Please follow these instructions carefully:  1.  Shower with CHG Soap the night before surgery and the  morning  of Surgery.  2.  If you choose to wash your hair, wash your hair first as usual with your  normal  shampoo.  3.  After you shampoo, rinse your hair and body thoroughly to remove the  shampoo.                           4.  Use CHG as you would any other liquid soap.  You can apply chg directly  to the skin and wash                       Gently with a scrungie or clean washcloth.  5.  Apply the CHG Soap to your body ONLY FROM THE NECK DOWN.   Do not use on face/ open  Wound or open sores. Avoid contact with eyes, ears mouth and genitals (private parts).                       Wash face,  Genitals (private parts) with your normal soap.             6.  Wash thoroughly, paying special attention to the area where your surgery  will be performed.  7.  Thoroughly rinse your body with warm water from the neck down.  8.  DO NOT shower/wash with your normal soap after using and rinsing off  the CHG Soap.                9.  Pat yourself dry with a clean towel.            10.  Wear clean pajamas.            11.  Place clean sheets on your bed the night of your first shower and do not  sleep with pets. Day of Surgery : Do not apply any lotions/deodorants the morning of surgery.  Please wear clean clothes to the hospital/surgery center.  FAILURE TO FOLLOW THESE INSTRUCTIONS MAY RESULT IN THE CANCELLATION OF YOUR SURGERY PATIENT SIGNATURE_________________________________  NURSE SIGNATURE__________________________________  ________________________________________________________________________

## 2018-03-05 ENCOUNTER — Other Ambulatory Visit: Payer: Self-pay

## 2018-03-05 ENCOUNTER — Encounter (HOSPITAL_COMMUNITY)
Admission: RE | Admit: 2018-03-05 | Discharge: 2018-03-05 | Disposition: A | Discharge status: Home / Self Care | Payer: BLUE CROSS/BLUE SHIELD | Source: Ambulatory Visit | Attending: Urology | Admitting: Urology

## 2018-03-05 ENCOUNTER — Encounter (HOSPITAL_COMMUNITY): Payer: Self-pay

## 2018-03-05 DIAGNOSIS — Z01812 Encounter for preprocedural laboratory examination: Secondary | ICD-10-CM | POA: Insufficient documentation

## 2018-03-05 DIAGNOSIS — N200 Calculus of kidney: Secondary | ICD-10-CM | POA: Insufficient documentation

## 2018-03-05 LAB — CBC
HEMATOCRIT: 37.8 % (ref 36.0–46.0)
Hemoglobin: 12.4 g/dL (ref 12.0–15.0)
MCH: 27.1 pg (ref 26.0–34.0)
MCHC: 32.8 g/dL (ref 30.0–36.0)
MCV: 82.7 fL (ref 78.0–100.0)
Platelets: 274 10*3/uL (ref 150–400)
RBC: 4.57 MIL/uL (ref 3.87–5.11)
RDW: 14.5 % (ref 11.5–15.5)
WBC: 5.2 10*3/uL (ref 4.0–10.5)

## 2018-03-05 LAB — PREGNANCY, URINE: PREG TEST UR: NEGATIVE

## 2018-03-06 ENCOUNTER — Other Ambulatory Visit: Payer: Self-pay | Admitting: Radiology

## 2018-03-08 ENCOUNTER — Inpatient Hospital Stay (HOSPITAL_COMMUNITY)
Admission: AD | Admit: 2018-03-08 | Discharge: 2018-03-13 | DRG: 660 | Disposition: A | Discharge status: Home / Self Care | Payer: BLUE CROSS/BLUE SHIELD | Source: Ambulatory Visit | Attending: Urology | Admitting: Urology

## 2018-03-08 ENCOUNTER — Ambulatory Visit (HOSPITAL_COMMUNITY): Payer: BLUE CROSS/BLUE SHIELD | Admitting: Anesthesiology

## 2018-03-08 ENCOUNTER — Other Ambulatory Visit: Payer: Self-pay

## 2018-03-08 ENCOUNTER — Encounter (HOSPITAL_COMMUNITY): Payer: Self-pay

## 2018-03-08 ENCOUNTER — Encounter (HOSPITAL_COMMUNITY)
Admission: AD | Disposition: A | Discharge status: Home / Self Care | Payer: Self-pay | Source: Ambulatory Visit | Attending: Urology

## 2018-03-08 ENCOUNTER — Ambulatory Visit (HOSPITAL_COMMUNITY)
Admission: RE | Admit: 2018-03-08 | Discharge: 2018-03-08 | Disposition: A | Discharge status: Home / Self Care | Payer: BLUE CROSS/BLUE SHIELD | Source: Ambulatory Visit | Attending: Urology | Admitting: Urology

## 2018-03-08 ENCOUNTER — Ambulatory Visit (HOSPITAL_COMMUNITY): Payer: BLUE CROSS/BLUE SHIELD

## 2018-03-08 DIAGNOSIS — Z8261 Family history of arthritis: Secondary | ICD-10-CM

## 2018-03-08 DIAGNOSIS — N200 Calculus of kidney: Secondary | ICD-10-CM | POA: Diagnosis not present

## 2018-03-08 DIAGNOSIS — R5082 Postprocedural fever: Secondary | ICD-10-CM | POA: Diagnosis not present

## 2018-03-08 DIAGNOSIS — D62 Acute posthemorrhagic anemia: Secondary | ICD-10-CM | POA: Diagnosis not present

## 2018-03-08 DIAGNOSIS — Z8249 Family history of ischemic heart disease and other diseases of the circulatory system: Secondary | ICD-10-CM

## 2018-03-08 DIAGNOSIS — A419 Sepsis, unspecified organism: Secondary | ICD-10-CM

## 2018-03-08 HISTORY — PX: IR URETERAL STENT PLACEMENT EXISTING ACCESS RIGHT: IMG6074

## 2018-03-08 HISTORY — PX: NEPHROLITHOTOMY: SHX5134

## 2018-03-08 LAB — PROTIME-INR
INR: 0.9
PROTHROMBIN TIME: 12.1 s (ref 11.4–15.2)

## 2018-03-08 LAB — CBC WITH DIFFERENTIAL/PLATELET
Basophils Absolute: 0 10*3/uL (ref 0.0–0.1)
Basophils Relative: 1 %
EOS ABS: 0.1 10*3/uL (ref 0.0–0.7)
Eosinophils Relative: 3 %
HEMATOCRIT: 38.8 % (ref 36.0–46.0)
Hemoglobin: 12.5 g/dL (ref 12.0–15.0)
Lymphocytes Relative: 43 %
Lymphs Abs: 1.7 10*3/uL (ref 0.7–4.0)
MCH: 26.6 pg (ref 26.0–34.0)
MCHC: 32.2 g/dL (ref 30.0–36.0)
MCV: 82.6 fL (ref 78.0–100.0)
MONO ABS: 0.4 10*3/uL (ref 0.1–1.0)
MONOS PCT: 10 %
Neutro Abs: 1.7 10*3/uL (ref 1.7–7.7)
Neutrophils Relative %: 43 %
Platelets: 250 10*3/uL (ref 150–400)
RBC: 4.7 MIL/uL (ref 3.87–5.11)
RDW: 14.5 % (ref 11.5–15.5)
WBC: 3.9 10*3/uL — ABNORMAL LOW (ref 4.0–10.5)

## 2018-03-08 LAB — BASIC METABOLIC PANEL
Anion gap: 7 (ref 5–15)
BUN: 17 mg/dL (ref 6–20)
CO2: 23 mmol/L (ref 22–32)
CREATININE: 0.98 mg/dL (ref 0.44–1.00)
Calcium: 8.8 mg/dL — ABNORMAL LOW (ref 8.9–10.3)
Chloride: 106 mmol/L (ref 98–111)
GFR calc Af Amer: >60 mL/min (ref >60)
GFR calc non Af Amer: >60 mL/min (ref >60)
GLUCOSE: 85 mg/dL (ref 70–99)
Potassium: 4.4 mmol/L (ref 3.5–5.1)
Sodium: 136 mmol/L (ref 135–145)

## 2018-03-08 LAB — ABO/RH: ABO/RH(D): O NEG

## 2018-03-08 SURGERY — NEPHROLITHOTOMY PERCUTANEOUS
Anesthesia: General | Laterality: Right

## 2018-03-08 MED ORDER — PHENYLEPHRINE 40 MCG/ML (10ML) SYRINGE FOR IV PUSH (FOR BLOOD PRESSURE SUPPORT)
PREFILLED_SYRINGE | INTRAVENOUS | Status: DC | PRN
  Administered 2018-03-08 (×5): 80 ug via INTRAVENOUS

## 2018-03-08 MED ORDER — LIDOCAINE 2% (20 MG/ML) 5 ML SYRINGE
INTRAMUSCULAR | Status: DC | PRN
  Administered 2018-03-08: 100 mg via INTRAVENOUS

## 2018-03-08 MED ORDER — OXYBUTYNIN CHLORIDE 5 MG PO TABS: 5.0000 mg | ORAL_TABLET | Freq: Three times a day (TID) | ORAL | Status: DC | PRN

## 2018-03-08 MED ORDER — OXYCODONE HCL 5 MG/5ML PO SOLN
5.0000 mg | Freq: Once | ORAL | Status: DC | PRN
  Filled 2018-03-08: qty 5

## 2018-03-08 MED ORDER — MEPERIDINE HCL 50 MG/ML IJ SOLN
6.2500 mg | INTRAMUSCULAR | Status: DC | PRN
  Administered 2018-03-08: 12.5 mg via INTRAVENOUS

## 2018-03-08 MED ORDER — PROPOFOL 10 MG/ML IV BOLUS
INTRAVENOUS | Status: DC | PRN
  Administered 2018-03-08: 150 mg via INTRAVENOUS

## 2018-03-08 MED ORDER — PROPOFOL 10 MG/ML IV BOLUS
INTRAVENOUS | Status: AC
  Filled 2018-03-08: qty 20

## 2018-03-08 MED ORDER — SUCCINYLCHOLINE CHLORIDE 200 MG/10ML IV SOSY
PREFILLED_SYRINGE | INTRAVENOUS | Status: AC
  Filled 2018-03-08: qty 10

## 2018-03-08 MED ORDER — ACETAMINOPHEN 160 MG/5ML PO SOLN: 325.0000 mg | ORAL | Status: DC | PRN

## 2018-03-08 MED ORDER — ROCURONIUM BROMIDE 10 MG/ML (PF) SYRINGE
PREFILLED_SYRINGE | INTRAVENOUS | Status: DC | PRN
  Administered 2018-03-08: 50 mg via INTRAVENOUS

## 2018-03-08 MED ORDER — LACTATED RINGERS IV SOLN
INTRAVENOUS | Status: DC
  Administered 2018-03-08 (×2): via INTRAVENOUS

## 2018-03-08 MED ORDER — MEPERIDINE HCL 50 MG/ML IJ SOLN
INTRAMUSCULAR | Status: AC
  Filled 2018-03-08: qty 1

## 2018-03-08 MED ORDER — ONDANSETRON HCL 4 MG/2ML IJ SOLN
4.0000 mg | INTRAMUSCULAR | Status: DC | PRN
  Administered 2018-03-09 – 2018-03-12 (×11): 4 mg via INTRAVENOUS
  Filled 2018-03-08 (×11): qty 2

## 2018-03-08 MED ORDER — ONDANSETRON HCL 4 MG/2ML IJ SOLN: 4.0000 mg | Freq: Once | INTRAMUSCULAR | Status: DC | PRN

## 2018-03-08 MED ORDER — SENNA 8.6 MG PO TABS
1.0000 | ORAL_TABLET | Freq: Two times a day (BID) | ORAL | Status: DC
  Administered 2018-03-08 – 2018-03-12 (×7): 8.6 mg via ORAL
  Filled 2018-03-08 (×7): qty 1

## 2018-03-08 MED ORDER — IOPAMIDOL (ISOVUE-300) INJECTION 61%
15.0000 mL | Freq: Once | INTRAVENOUS | Status: AC | PRN
  Administered 2018-03-08: 15 mL

## 2018-03-08 MED ORDER — IOHEXOL 300 MG/ML  SOLN
INTRAMUSCULAR | Status: DC | PRN
  Administered 2018-03-08: 40 mL via URETHRAL

## 2018-03-08 MED ORDER — CEFAZOLIN SODIUM-DEXTROSE 2-4 GM/100ML-% IV SOLN
INTRAVENOUS | Status: AC
  Filled 2018-03-08: qty 100

## 2018-03-08 MED ORDER — PHENYLEPHRINE 40 MCG/ML (10ML) SYRINGE FOR IV PUSH (FOR BLOOD PRESSURE SUPPORT)
PREFILLED_SYRINGE | INTRAVENOUS | Status: AC
  Filled 2018-03-08: qty 10

## 2018-03-08 MED ORDER — ACETAMINOPHEN 325 MG PO TABS: 325.0000 mg | ORAL_TABLET | ORAL | Status: DC | PRN

## 2018-03-08 MED ORDER — ONDANSETRON HCL 4 MG/2ML IJ SOLN
INTRAMUSCULAR | Status: DC | PRN
  Administered 2018-03-08: 4 mg via INTRAVENOUS

## 2018-03-08 MED ORDER — CEFAZOLIN SODIUM-DEXTROSE 2-4 GM/100ML-% IV SOLN: 2.0000 g | INTRAVENOUS | Status: DC

## 2018-03-08 MED ORDER — LIDOCAINE HCL 1 % IJ SOLN
INTRAMUSCULAR | Status: AC
  Filled 2018-03-08: qty 20

## 2018-03-08 MED ORDER — OXYCODONE HCL 5 MG PO TABS: 5.0000 mg | ORAL_TABLET | Freq: Once | ORAL | Status: DC | PRN

## 2018-03-08 MED ORDER — ZOLPIDEM TARTRATE 5 MG PO TABS: 5.0000 mg | ORAL_TABLET | Freq: Every evening | ORAL | Status: DC | PRN

## 2018-03-08 MED ORDER — SUGAMMADEX SODIUM 200 MG/2ML IV SOLN
INTRAVENOUS | Status: DC | PRN
  Administered 2018-03-08: 150 mg via INTRAVENOUS

## 2018-03-08 MED ORDER — FENTANYL CITRATE (PF) 100 MCG/2ML IJ SOLN
INTRAMUSCULAR | Status: AC
  Filled 2018-03-08: qty 4

## 2018-03-08 MED ORDER — SUGAMMADEX SODIUM 200 MG/2ML IV SOLN
INTRAVENOUS | Status: AC
  Filled 2018-03-08: qty 2

## 2018-03-08 MED ORDER — SODIUM CHLORIDE 0.9 % IV SOLN
INTRAVENOUS | Status: DC
  Administered 2018-03-08: 08:00:00 via INTRAVENOUS

## 2018-03-08 MED ORDER — FENTANYL CITRATE (PF) 100 MCG/2ML IJ SOLN
25.0000 ug | INTRAMUSCULAR | Status: DC | PRN
  Administered 2018-03-08: 50 ug via INTRAVENOUS

## 2018-03-08 MED ORDER — MIDAZOLAM HCL 2 MG/2ML IJ SOLN
INTRAMUSCULAR | Status: AC
  Filled 2018-03-08: qty 6

## 2018-03-08 MED ORDER — SUCCINYLCHOLINE CHLORIDE 200 MG/10ML IV SOSY
PREFILLED_SYRINGE | INTRAVENOUS | Status: DC | PRN
  Administered 2018-03-08: 120 mg via INTRAVENOUS

## 2018-03-08 MED ORDER — CEFAZOLIN SODIUM-DEXTROSE 2-4 GM/100ML-% IV SOLN
2.0000 g | INTRAVENOUS | Status: AC
  Administered 2018-03-08: 2 g via INTRAVENOUS

## 2018-03-08 MED ORDER — HYDROMORPHONE HCL 1 MG/ML IJ SOLN
0.5000 mg | INTRAMUSCULAR | Status: DC | PRN
  Administered 2018-03-08 – 2018-03-12 (×23): 1 mg via INTRAVENOUS
  Filled 2018-03-08 (×23): qty 1

## 2018-03-08 MED ORDER — DEXAMETHASONE SODIUM PHOSPHATE 10 MG/ML IJ SOLN
INTRAMUSCULAR | Status: AC
  Filled 2018-03-08: qty 1

## 2018-03-08 MED ORDER — LIDOCAINE HCL (PF) 1 % IJ SOLN
INTRAMUSCULAR | Status: AC | PRN
  Administered 2018-03-08: 10 mL

## 2018-03-08 MED ORDER — ACETAMINOPHEN 325 MG PO TABS
650.0000 mg | ORAL_TABLET | ORAL | Status: DC | PRN
  Administered 2018-03-10 (×2): 650 mg via ORAL
  Filled 2018-03-08 (×3): qty 2

## 2018-03-08 MED ORDER — OXYCODONE HCL 5 MG PO TABS
5.0000 mg | ORAL_TABLET | ORAL | Status: DC | PRN
  Administered 2018-03-09 – 2018-03-13 (×4): 5 mg via ORAL
  Filled 2018-03-08 (×5): qty 1

## 2018-03-08 MED ORDER — DEXTROSE-NACL 5-0.45 % IV SOLN
INTRAVENOUS | Status: DC
  Administered 2018-03-08 – 2018-03-09 (×4): via INTRAVENOUS

## 2018-03-08 MED ORDER — FENTANYL CITRATE (PF) 100 MCG/2ML IJ SOLN
INTRAMUSCULAR | Status: DC | PRN
  Administered 2018-03-08 (×2): 50 ug via INTRAVENOUS

## 2018-03-08 MED ORDER — SODIUM CHLORIDE 0.9 % IR SOLN
Status: DC | PRN
  Administered 2018-03-08: 24000 mL

## 2018-03-08 MED ORDER — CEFAZOLIN SODIUM-DEXTROSE 1-4 GM/50ML-% IV SOLN
1.0000 g | Freq: Three times a day (TID) | INTRAVENOUS | Status: DC
  Administered 2018-03-08 – 2018-03-10 (×7): 1 g via INTRAVENOUS
  Filled 2018-03-08 (×8): qty 50

## 2018-03-08 MED ORDER — DEXAMETHASONE SODIUM PHOSPHATE 10 MG/ML IJ SOLN
INTRAMUSCULAR | Status: DC | PRN
  Administered 2018-03-08: 10 mg via INTRAVENOUS

## 2018-03-08 MED ORDER — FENTANYL CITRATE (PF) 100 MCG/2ML IJ SOLN
INTRAMUSCULAR | Status: AC | PRN
  Administered 2018-03-08 (×2): 50 ug via INTRAVENOUS

## 2018-03-08 MED ORDER — MIDAZOLAM HCL 2 MG/2ML IJ SOLN
INTRAMUSCULAR | Status: AC | PRN
  Administered 2018-03-08: 2 mg via INTRAVENOUS
  Administered 2018-03-08 (×2): 1 mg via INTRAVENOUS

## 2018-03-08 MED ORDER — ALBUMIN HUMAN 5 % IV SOLN
INTRAVENOUS | Status: DC | PRN
  Administered 2018-03-08: 14:00:00 via INTRAVENOUS

## 2018-03-08 MED ORDER — FENTANYL CITRATE (PF) 100 MCG/2ML IJ SOLN
INTRAMUSCULAR | Status: AC
  Filled 2018-03-08: qty 2

## 2018-03-08 MED ORDER — IOPAMIDOL (ISOVUE-300) INJECTION 61%
INTRAVENOUS | Status: AC
  Administered 2018-03-08: 15 mL
  Filled 2018-03-08: qty 50

## 2018-03-08 MED ORDER — ONDANSETRON HCL 4 MG/2ML IJ SOLN
INTRAMUSCULAR | Status: AC
  Filled 2018-03-08: qty 2

## 2018-03-08 SURGICAL SUPPLY — 51 items
APL SKNCLS STERI-STRIP NONHPOA (GAUZE/BANDAGES/DRESSINGS) ×2
BAG URINE DRAINAGE (UROLOGICAL SUPPLIES) ×2 IMPLANT
BASKET STONE 1.7 NGAGE (UROLOGICAL SUPPLIES) ×1 IMPLANT
BASKET ZERO TIP NITINOL 2.4FR (BASKET) ×1 IMPLANT
BENZOIN TINCTURE PRP APPL 2/3 (GAUZE/BANDAGES/DRESSINGS) ×6 IMPLANT
BLADE SURG 15 STRL LF DISP TIS (BLADE) ×1 IMPLANT
BLADE SURG 15 STRL SS (BLADE) ×3
BSKT STON RTRVL ZERO TP 2.4FR (BASKET)
CATCHER STONE W/TUBE ADAPTER (UROLOGICAL SUPPLIES) IMPLANT
CATH FOLEY 2W COUNCIL 20FR 5CC (CATHETERS) IMPLANT
CATH FOLEY 2W COUNCIL 5CC 18FR (CATHETERS) ×2 IMPLANT
CATH INTERMIT  6FR 70CM (CATHETERS) ×2 IMPLANT
CATH ROBINSON RED A/P 20FR (CATHETERS) IMPLANT
CATH X-FORCE N30 NEPHROSTOMY (TUBING) ×3 IMPLANT
COVER SURGICAL LIGHT HANDLE (MISCELLANEOUS) ×1 IMPLANT
DRAPE C-ARM 42X120 X-RAY (DRAPES) ×3 IMPLANT
DRAPE LINGEMAN PERC (DRAPES) ×3 IMPLANT
DRAPE SURG IRRIG POUCH 19X23 (DRAPES) ×3 IMPLANT
DRSG PAD ABDOMINAL 8X10 ST (GAUZE/BANDAGES/DRESSINGS) IMPLANT
DRSG TEGADERM 8X12 (GAUZE/BANDAGES/DRESSINGS) ×6 IMPLANT
FIBER LASER FLEXIVA 1000 (UROLOGICAL SUPPLIES) IMPLANT
FIBER LASER FLEXIVA 365 (UROLOGICAL SUPPLIES) IMPLANT
FIBER LASER FLEXIVA 550 (UROLOGICAL SUPPLIES) IMPLANT
FIBER LASER TRAC TIP (UROLOGICAL SUPPLIES) IMPLANT
GAUZE SPONGE 4X4 12PLY STRL (GAUZE/BANDAGES/DRESSINGS) IMPLANT
GLOVE BIOGEL M 8.0 STRL (GLOVE) ×11 IMPLANT
GOWN STRL REUS W/TWL XL LVL3 (GOWN DISPOSABLE) ×5 IMPLANT
GUIDEWIRE AMPLAZ .035X145 (WIRE) ×6 IMPLANT
GUIDEWIRE STR DUAL SENSOR (WIRE) ×2 IMPLANT
KIT BASIN OR (CUSTOM PROCEDURE TRAY) ×3 IMPLANT
KIT PROBE 340X3.4XDISP GRN (MISCELLANEOUS) IMPLANT
KIT PROBE TRILOGY 3.4X340 (MISCELLANEOUS) ×3
MANIFOLD NEPTUNE II (INSTRUMENTS) ×3 IMPLANT
NS IRRIG 1000ML POUR BTL (IV SOLUTION) ×3 IMPLANT
PACK CYSTO (CUSTOM PROCEDURE TRAY) ×3 IMPLANT
PROBE LITHOCLAST ULTRA 3.8X403 (UROLOGICAL SUPPLIES) IMPLANT
PROBE PNEUMATIC 1.0MMX570MM (UROLOGICAL SUPPLIES) IMPLANT
SET IRRIG Y TYPE TUR BLADDER L (SET/KITS/TRAYS/PACK) IMPLANT
SHEATH PEELAWAY SET 9 (SHEATH) ×3 IMPLANT
SPONGE LAP 4X18 RFD (DISPOSABLE) ×3 IMPLANT
STONE CATCHER W/TUBE ADAPTER (UROLOGICAL SUPPLIES) IMPLANT
SUT SILK 2 0 30  PSL (SUTURE) ×2
SUT SILK 2 0 30 PSL (SUTURE) ×1 IMPLANT
SYR 10ML LL (SYRINGE) ×3 IMPLANT
SYR 20CC LL (SYRINGE) ×6 IMPLANT
TRAY FOLEY MTR SLVR 14FR STAT (SET/KITS/TRAYS/PACK) IMPLANT
TRAY FOLEY MTR SLVR 16FR STAT (SET/KITS/TRAYS/PACK) ×3 IMPLANT
TUBING CONNECTING 10 (TUBING) ×5 IMPLANT
TUBING CONNECTING 10' (TUBING) ×3
TUBING UROLOGY SET (TUBING) ×3 IMPLANT
WATER STERILE IRR 1000ML POUR (IV SOLUTION) ×3 IMPLANT

## 2018-03-08 NOTE — Procedures (Signed)
Right renal calculi  S/p US and fluoro rt pcn access for PCNL later today  No comp Stable ebl min Full report in pacs

## 2018-03-08 NOTE — Anesthesia Postprocedure Evaluation (Signed)
Anesthesia Post Note  Patient: Audrey Gonzalez  Procedure(s) Performed: RIGHT NEPHROLITHOTOMY PERCUTANEOUS (Right )     Patient location during evaluation: PACU Anesthesia Type: General Level of consciousness: awake and alert Pain management: pain level controlled Vital Signs Assessment: post-procedure vital signs reviewed and stable Respiratory status: spontaneous breathing, nonlabored ventilation, respiratory function stable and patient connected to nasal cannula oxygen Cardiovascular status: blood pressure returned to baseline and stable Postop Assessment: no apparent nausea or vomiting Anesthetic complications: no    Last Vitals:  Vitals:   03/08/18 1145 03/08/18 1200  BP: 106/75 96/66  Pulse: 72 64  Resp:  14  Temp: 36.5 C   SpO2: 100% 91%    Last Pain:  Vitals:   03/08/18 1200  TempSrc:   PainSc: Asleep                 Yuna Pizzolato

## 2018-03-08 NOTE — Anesthesia Procedure Notes (Signed)
Procedure Name: Intubation Date/Time: 03/08/2018 12:17 PM Performed by: Lind Covert, CRNA Pre-anesthesia Checklist: Patient identified, Emergency Drugs available, Suction available, Patient being monitored and Timeout performed Patient Re-evaluated:Patient Re-evaluated prior to induction Oxygen Delivery Method: Circle system utilized Preoxygenation: Pre-oxygenation with 100% oxygen Induction Type: IV induction Laryngoscope Size: Mac and 4 Grade View: Grade I Tube type: Oral Tube size: 7.0 mm Number of attempts: 1 Airway Equipment and Method: Stylet Placement Confirmation: ETT inserted through vocal cords under direct vision,  positive ETCO2 and breath sounds checked- equal and bilateral Secured at: 21 cm Tube secured with: Tape Dental Injury: Teeth and Oropharynx as per pre-operative assessment

## 2018-03-08 NOTE — Transfer of Care (Signed)
Immediate Anesthesia Transfer of Care Note  Patient: Audrey Gonzalez  Procedure(s) Performed: RIGHT NEPHROLITHOTOMY PERCUTANEOUS (Right )  Patient Location: PACU  Anesthesia Type:General  Level of Consciousness: sedated  Airway & Oxygen Therapy: Patient Spontanous Breathing and Patient connected to face mask oxygen  Post-op Assessment: Report given to RN and Post -op Vital signs reviewed and stable  Post vital signs: Reviewed and stable  Last Vitals:  Vitals Value Taken Time  BP    Temp    Pulse 92 03/08/2018  2:31 PM  Resp    SpO2 97 % 03/08/2018  2:31 PM  Vitals shown include unvalidated device data.  Last Pain:  Vitals:   03/08/18 1200  TempSrc:   PainSc: Asleep         Complications: No apparent anesthesia complications

## 2018-03-08 NOTE — Anesthesia Preprocedure Evaluation (Addendum)
Anesthesia Evaluation  Patient identified by MRN, date of birth, ID band Patient awake    Reviewed: Allergy & Precautions, NPO status , Patient's Chart, lab work & pertinent test results  Airway Mallampati: III  TM Distance: >3 FB Neck ROM: Full    Dental no notable dental hx.    Pulmonary neg pulmonary ROS,    Pulmonary exam normal breath sounds clear to auscultation       Cardiovascular negative cardio ROS Normal cardiovascular exam Rhythm:Regular Rate:Normal     Neuro/Psych  Headaches, negative psych ROS   GI/Hepatic negative GI ROS, Neg liver ROS,   Endo/Other  negative endocrine ROS  Renal/GU Renal disease     Musculoskeletal negative musculoskeletal ROS (+)   Abdominal   Peds  Hematology negative hematology ROS (+)   Anesthesia Other Findings right ureteral calculus  Reproductive/Obstetrics hcg negative                             Anesthesia Physical  Anesthesia Plan  ASA: I  Anesthesia Plan: General   Post-op Pain Management:    Induction: Intravenous  PONV Risk Score and Plan: 3 and Midazolam, Dexamethasone, Ondansetron, Treatment may vary due to age or medical condition and Scopolamine patch - Pre-op  Airway Management Planned: LMA  Additional Equipment:   Intra-op Plan:   Post-operative Plan: Extubation in OR  Informed Consent: I have reviewed the patients History and Physical, chart, labs and discussed the procedure including the risks, benefits and alternatives for the proposed anesthesia with the patient or authorized representative who has indicated his/her understanding and acceptance.   Dental advisory given  Plan Discussed with: CRNA, Surgeon and Anesthesiologist  Anesthesia Plan Comments:        Anesthesia Quick Evaluation

## 2018-03-08 NOTE — H&P (Signed)
H&P  Chief Complaint: Kidney stones  History of Present Illness: Audrey Gonzalez is a 32 y.o. year old female who presents for right pcnl--2 large lower pole stones, the largest being 20.2 mm in size.  Past Medical History:  Diagnosis Date  . Anemia    during pregnancy  . Arthritis    SEEING RHEUMATOLOGIST TO DETERMINE TREATMENT; PRESENT WITH VISIBLE SWELLING OF SMALL JOINTS IN HANDS   . Bilateral swelling of feet    REPORTS MAY BE DUE TO ARTHRITIS ; CURENTLY SEEING RHEUM TO DETERMINE TREATMENT   . History of kidney stones   . Kidney stones    had multiple stones during pregnancy, followed by [facility]  . Migraines    HAD ONE LAST NIGHT ; MOSTLY OCCURS IF SHE GOES LONG PERIODS WITHOUT EATIING     Past Surgical History:  Procedure Laterality Date  . CYSTOSCOPY WITH RETROGRADE PYELOGRAM, URETEROSCOPY AND STENT PLACEMENT Right 06/04/2015   Procedure: CYSTOSCOPY WITH  RIGHT RETROGRADE PYELOGRAM,RIGHT URETEROSCOPY , LASER LITHOTRIPSY AND BASKET EXTRACTION OF STONE;  Surgeon: Marcine Matar, MD;  Location: [facility];  Service: Urology;  Laterality: Right;  . CYSTOSCOPY WITH RETROGRADE PYELOGRAM, URETEROSCOPY AND STENT PLACEMENT Right 11/24/2017   Procedure: CYSTOSCOPY WITH RIGHT  RETROGRADE RIGHT URETEROSCOPY, RIGHT STENT PLACEMENT;  Surgeon: Marcine Matar, MD;  Location: [facility];  Service: Urology;  Laterality: Right;  . HOLMIUM LASER APPLICATION Right 06/04/2015   Procedure: HOLMIUM LASER APPLICATION;  Surgeon: Marcine Matar, MD;  Location: [facility];  Service: Urology;  Laterality: Right;  . STONE EXTRACTION WITH BASKET Right 06/04/2015   Procedure: STONE EXTRACTION WITH BASKET;  Surgeon: Marcine Matar, MD;  Location: [facility];  Service: Urology;  Laterality: Right;  . TUBAL LIGATION  2010    Home Medications:  Medications Prior to Admission  Medication Sig Dispense Refill  . Colloidal Oatmeal (GOLD BOND ECZEMA RELIEF EX) Apply 1 application topically daily as needed  (eczema).    Marland Kitchen ibuprofen (ADVIL,MOTRIN) 600 MG tablet Take 1 tablet (600 mg total) by mouth every 6 (six) hours as needed. (Patient taking differently: Take 600 mg by mouth every 6 (six) hours as needed for moderate pain. ) 30 tablet 0  . sulfamethoxazole-trimethoprim (BACTRIM DS,SEPTRA DS) 800-160 MG tablet Take 1 tablet by mouth 2 (two) times daily.    . cephALEXin (KEFLEX) 500 MG capsule Take 1 capsule (500 mg total) by mouth 2 (two) times daily. (Patient not taking: Reported on 02/27/2018) 10 capsule 0  . ondansetron (ZOFRAN) 4 MG tablet Take 1 tablet (4 mg total) by mouth every 8 (eight) hours as needed for nausea or vomiting. (Patient not taking: Reported on 02/27/2018) 10 tablet 0  . oxybutynin (DITROPAN) 5 MG tablet Take 1 tablet (5 mg total) by mouth every 8 (eight) hours as needed for bladder spasms. (Patient not taking: Reported on 02/27/2018) 30 tablet 1  . traMADol (ULTRAM) 50 MG tablet Take 1 tablet (50 mg total) by mouth every 6 (six) hours as needed for severe pain. (Patient not taking: Reported on 11/15/2017) 10 tablet 0    Allergies: No Known Allergies  Family History  Problem Relation Age of Onset  . Hypertension Mother   . Arthritis Father   . Kawasaki disease Son     Social History:  reports that she has never smoked. She has never used smokeless tobacco. She reports that she drank alcohol. She reports that she does not use drugs.  ROS: A complete review of systems was performed.  All systems  are negative except for pertinent findings as noted.  Physical Exam:  Vital signs in last 24 hours: Temp:  [98 F (36.7 C)] 98 F (36.7 C) (08/22 0749) Pulse Rate:  [71-77] 71 (08/22 1033) Resp:  [16-24] 21 (08/22 1033) BP: (109-111)/(74-80) 111/74 (08/22 1033) SpO2:  [100 %] 100 % (08/22 1033) Weight:  [70.3 kg] 70.3 kg (08/22 0753) General:  Alert and oriented, No acute distress HEENT: Normocephalic, atraumatic Neck: No JVD or lymphadenopathy Cardiovascular: Regular rate and  rhythm Lungs: Clear bilaterally Abdomen: Soft, nontender, nondistended, no abdominal masses Back: No CVA tenderness Extremities: No edema Neurologic: Grossly intact  Laboratory Data:  Results for orders placed or performed during the hospital encounter of 03/08/18 (from the past 24 hour(s))  Basic metabolic panel     Status: Abnormal   Collection Time: 03/08/18  8:16 AM  Result Value Ref Range   Sodium 136 135 - 145 mmol/L   Potassium 4.4 3.5 - 5.1 mmol/L   Chloride 106 98 - 111 mmol/L   CO2 23 22 - 32 mmol/L   Glucose, Bld 85 70 - 99 mg/dL   BUN 17 6 - 20 mg/dL   Creatinine, Ser 4.540.98 0.44 - 1.00 mg/dL   Calcium 8.8 (L) 8.9 - 10.3 mg/dL   GFR calc non Af Amer >60 >60 mL/min   GFR calc Af Amer >60 >60 mL/min   Anion gap 7 5 - 15  CBC with Differential/Platelet     Status: Abnormal   Collection Time: 03/08/18  8:16 AM  Result Value Ref Range   WBC 3.9 (L) 4.0 - 10.5 K/uL   RBC 4.70 3.87 - 5.11 MIL/uL   Hemoglobin 12.5 12.0 - 15.0 g/dL   HCT 09.838.8 11.936.0 - 14.746.0 %   MCV 82.6 78.0 - 100.0 fL   MCH 26.6 26.0 - 34.0 pg   MCHC 32.2 30.0 - 36.0 g/dL   RDW 82.914.5 56.211.5 - 13.015.5 %   Platelets 250 150 - 400 K/uL   Neutrophils Relative % 43 %   Neutro Abs 1.7 1.7 - 7.7 K/uL   Lymphocytes Relative 43 %   Lymphs Abs 1.7 0.7 - 4.0 K/uL   Monocytes Relative 10 %   Monocytes Absolute 0.4 0.1 - 1.0 K/uL   Eosinophils Relative 3 %   Eosinophils Absolute 0.1 0.0 - 0.7 K/uL   Basophils Relative 1 %   Basophils Absolute 0.0 0.0 - 0.1 K/uL  Protime-INR     Status: None   Collection Time: 03/08/18  8:16 AM  Result Value Ref Range   Prothrombin Time 12.1 11.4 - 15.2 seconds   INR 0.90    No results found for this or any previous visit (from the past 240 hour(s)). Creatinine: Recent Labs    03/08/18 0816  CREATININE 0.98    Radiologic Imaging: No results found.  Impression/Assessment:  LArge right renal calculi  Plan:  Right PCNL  Bertram MillardStephen M Channon Ambrosini 03/08/2018, 10:41 AM  Bertram MillardStephen M.  [place] MD

## 2018-03-08 NOTE — Op Note (Signed)
Preoperative diagnosis: Large right lower pole renal calculi (greater than 21 mm in diameter)  Postoperative diagnosis: Same  Principal procedure: Right percutaneous nephrolithotomy of 2 renal calculi (greater than 20 mm), antegrade nephrostogram, fluoroscopic interpretation, placement of 29 French council tip catheter functioning is nephrostomy tube  Surgeon: Mica Releford  Anesthesia: General endotracheal  Complications: None specimen: Stone fragments  Estimated blood loss: Approximately 600 mL  Drains: Above mentioned nephrostomy tube, Foley catheter  Indications: 32 year old female with recurrent urolithiasis.  She had a ureteral stone managed earlier this year.  She has 2 remaining large right lower pole renal calculi in adjacent calyces.  Because of the size of the stones and the patient's age, it was recommended that she have these treated.  Treatment options were discussed including percutaneous nephrolithotomy and ureteroscopic management.  Risks and complications of each of these were discussed.  Specifically, with percutaneous nephrolithotomy were discussed the invasive nature of the procedure through the flank, blood loss, infection, need for transfusion, hospitalization at least overnight, need for proper nephrostomy tube drainage as well as anesthetic complications.  She understands these.  She desires to proceed.  Findings: Nephrostomy tube placement was performed by Dr. Reesa Chew.  This was through the posterior lateral lower pole calyx which held the larger of the 2 stones.  The stones were round in nature, not spiculated.  There was no significant pyelocaliectasis of the remaining pyelocalyceal system.  Antegrade nephrostogram following removal of all stone burden revealed antegrade flow of the contrast without significant obstruction.  The ureter was somewhat widened, however.  Description of procedure: The patient was properly identified and marked in the holding area.  She received  preoperative IV antibiotics.  She was taken to the operating room where general anesthetic was administered.  Foley catheter was placed into her bladder and hooked to bag drainage.  She was then placed in the prone position.  All extremities and pressure points were padded appropriately.  Her right flank was prepped and draped around the indwelling Kumpe catheter.  Proper timeout was then performed.  I then passed a Super Stiff guidewire through the Kumpe catheter and using fluoroscopic guidance, this was passed into the bladder.  The Kumpe catheter was removed.  Puncture site was incised for approximately 1.5 cm.  I then passed the peel-away sheath into the upper ureter over the guidewire.  The core was removed and a second safety guidewire was passed through the distal ureter and into the bladder using fluoroscopic guidance.  Over the working wire, I then placed the 26 [place] NephroMax balloon, and using fluoroscopic guidance this was passed right up to the stone.  The balloon was then filled to 20 atm of pressure.  This was held for 3 minutes.  The nephrostomy sheath was then passed over top of the balloon up to the stone using fluoroscopic guidance.  The balloon was deflated and removed.  Nephroscope was then passed into the calyx, up to the stone.  Small clots were then picked out.  The trilogy lithotrite was then passed up to the stone.  Using combination of pneumatic/ultrasonic energy, the stone was fractured and multiple small bits, and most of it was aspirated into the trap.  2-3 small fragments were taken for analysis.  Using the trilogy, the nephrolithotomy for this 1 stone was quite efficient.  Inspection carefully of the calyx revealed no significant fragments.  The scope was advanced into the renal pelvis were 2-3 small fragments and several clots were grasped and extracted.  No  further stones were noted at this point.  The other stone was present in an adjacent calyx.  On CT scan, there was no  significant parenchymal tissue between the 2 of these.  Using fluoroscopic guidance, I gently probed in the area of the stone.  Once the stone was engaged with the tip of a 6 [place] open-ended catheter, I verified the presence of the calyx or calyceal diverticulum with contrast.  Once the stone was found to be in this area, I dilated the tract somewhat with the tip of the scope, and into this cavity holding the remaining stone.  The stone was also fragmented and removed using the trilogy lithotrite.  No further stones were seen following management of the smaller 16 to 17 mm stone.  Verifying that I could not see any remaining fragments using the fluoroscope, the nephrolithotomy was completed.  I removed the scope and the lithotrite.  An 62 [place] council tip catheter was passed over the working guidewire into the area of the renal pelvis.  This was verified using contrast and fluoroscopic imaging.  Following this, 4 cc of combination contrast and saline were passed into the balloon of the catheter to verify positioning in the renal pelvis.  The sheath was then removed, the catheter was sutured to the skin using 2-0 silk suture.  The lateral aspect of the incision was also sutured using the same suture.  The catheter was irrigated and no significant clots or blood came out.  This was hooked to dependent drainage.  Dry sterile dressing was then placed.  The patient tolerated procedure well.  She was awakened, transferred to the supine position and taken to the PACU in stable condition.  She tolerated the procedure well.  Sponge needle instrument counts were correct x2.

## 2018-03-08 NOTE — H&P (Signed)
Chief Complaint: Patient was seen in consultation today for right percutaneous nephrostomy/nephroureteral catheter placement  Referring Physician(s): Dahlstedt,S  Supervising Physician: Ruel Favors  Patient Status: [facility] - Out-pt TBA  History of Present Illness: Audrey Gonzalez is a 32 y.o. female with history of nephrolithiasis including 2 large right lower pole renal calculi who presents today for right percutaneous nephrostomy/nephroureteral catheter placement prior to planned nephrolithotomy.  Past Medical History:  Diagnosis Date  . Anemia    during pregnancy  . Arthritis    SEEING RHEUMATOLOGIST TO DETERMINE TREATMENT; PRESENT WITH VISIBLE SWELLING OF SMALL JOINTS IN HANDS   . Bilateral swelling of feet    REPORTS MAY BE DUE TO ARTHRITIS ; CURENTLY SEEING RHEUM TO DETERMINE TREATMENT   . History of kidney stones   . Kidney stones    had multiple stones during pregnancy, followed by [facility]  . Migraines    HAD ONE LAST NIGHT ; MOSTLY OCCURS IF SHE GOES LONG PERIODS WITHOUT EATIING     Past Surgical History:  Procedure Laterality Date  . CYSTOSCOPY WITH RETROGRADE PYELOGRAM, URETEROSCOPY AND STENT PLACEMENT Right 06/04/2015   Procedure: CYSTOSCOPY WITH  RIGHT RETROGRADE PYELOGRAM,RIGHT URETEROSCOPY , LASER LITHOTRIPSY AND BASKET EXTRACTION OF STONE;  Surgeon: Marcine Matar, MD;  Location: [facility];  Service: Urology;  Laterality: Right;  . CYSTOSCOPY WITH RETROGRADE PYELOGRAM, URETEROSCOPY AND STENT PLACEMENT Right 11/24/2017   Procedure: CYSTOSCOPY WITH RIGHT  RETROGRADE RIGHT URETEROSCOPY, RIGHT STENT PLACEMENT;  Surgeon: Marcine Matar, MD;  Location: [facility];  Service: Urology;  Laterality: Right;  . HOLMIUM LASER APPLICATION Right 06/04/2015   Procedure: HOLMIUM LASER APPLICATION;  Surgeon: Marcine Matar, MD;  Location: [facility];  Service: Urology;  Laterality: Right;  . STONE EXTRACTION WITH BASKET Right 06/04/2015   Procedure: STONE EXTRACTION WITH  BASKET;  Surgeon: Marcine Matar, MD;  Location: [facility];  Service: Urology;  Laterality: Right;  . TUBAL LIGATION  2010    Allergies: Patient has no known allergies.  Medications: Prior to Admission medications   Medication Sig Start Date End Date Taking? Authorizing Provider  Colloidal Oatmeal (GOLD BOND ECZEMA RELIEF EX) Apply 1 application topically daily as needed (eczema).   Yes [provider]  ibuprofen (ADVIL,MOTRIN) 600 MG tablet Take 1 tablet (600 mg total) by mouth every 6 (six) hours as needed. Patient taking differently: Take 600 mg by mouth every 6 (six) hours as needed for moderate pain.  10/29/17  Yes Shaune Pollack, MD  sulfamethoxazole-trimethoprim (BACTRIM DS,SEPTRA DS) 800-160 MG tablet Take 1 tablet by mouth 2 (two) times daily. 03/03/18 03/08/18 Yes [provider]  cephALEXin (KEFLEX) 500 MG capsule Take 1 capsule (500 mg total) by mouth 2 (two) times daily. Patient not taking: Reported on 02/27/2018 11/24/17   Marcine Matar, MD  ondansetron (ZOFRAN) 4 MG tablet Take 1 tablet (4 mg total) by mouth every 8 (eight) hours as needed for nausea or vomiting. Patient not taking: Reported on 02/27/2018 10/29/17   Shaune Pollack, MD  oxybutynin (DITROPAN) 5 MG tablet Take 1 tablet (5 mg total) by mouth every 8 (eight) hours as needed for bladder spasms. Patient not taking: Reported on 02/27/2018 11/24/17   Marcine Matar, MD  traMADol (ULTRAM) 50 MG tablet Take 1 tablet (50 mg total) by mouth every 6 (six) hours as needed for severe pain. Patient not taking: Reported on 11/15/2017 10/29/17   Shaune Pollack, MD     Family History  Problem Relation Age of Onset  . Hypertension Mother   .  Arthritis Father   . Kawasaki disease Son     Social History   Socioeconomic History  . Marital status: Single    Spouse name: Not on file  . Number of children: Not on file  . Years of education: Not on file  . Highest education level: Not on file  Occupational  History  . Not on file  Social Needs  . Financial resource strain: Not on file  . Food insecurity:    Worry: Not on file    Inability: Not on file  . Transportation needs:    Medical: Not on file    Non-medical: Not on file  Tobacco Use  . Smoking status: Never Smoker  . Smokeless tobacco: Never Used  Substance and Sexual Activity  . Alcohol use: Not Currently  . Drug use: No  . Sexual activity: Yes    Birth control/protection: Surgical  Lifestyle  . Physical activity:    Days per week: Not on file    Minutes per session: Not on file  . Stress: Not on file  Relationships  . Social connections:    Talks on phone: Not on file    Gets together: Not on file    Attends religious service: Not on file    Active member of club or organization: Not on file    Attends meetings of clubs or organizations: Not on file    Relationship status: Not on file  Other Topics Concern  . Not on file  Social History Narrative  . Not on file      Review of Systems currently denies fever, headache, chest pain, dyspnea, cough, abdominal/back pain, nausea, vomiting or bleeding.  Vital Signs: BP 109/80 (BP Location: Right Arm)   Pulse 77   Temp 98 F (36.7 C) (Oral)   Resp 16   Ht 5\' 6"  (1.676 m)   Wt 155 lb (70.3 kg)   LMP 03/04/2018 (Exact Date)   SpO2 100%   BMI 25.02 kg/m   Physical Exam awake, alert.  Chest clear to auscultation bilaterally.  Heart with regular rate and rhythm.  Abdomen soft, positive bowel sounds, nontender.  No lower extremity edema.  Imaging: No results found.  Labs:  CBC: Recent Labs    10/29/17 1424 11/21/17 1125 11/24/17 1730 03/05/18 0830  WBC 12.8* 5.0 11.7* 5.2  HGB 11.2* 12.6 12.8 12.4  HCT 34.9* 39.6 40.1 37.8  PLT 249 237 246 274    COAGS: No results for input(s): INR, APTT in the last 8760 hours.  BMP: Recent Labs    10/29/17 1424 11/24/17 1730  NA 139  --   K 4.1  --   CL 106  --   CO2 25  --   GLUCOSE 109*  --   BUN 14  --    CALCIUM 8.4*  --   CREATININE 0.81 0.67  GFRNONAA >60 >60  GFRAA >60 >60    LIVER FUNCTION TESTS: No results for input(s): BILITOT, AST, ALT, ALKPHOS, PROT, ALBUMIN in the last 8760 hours.  TUMOR MARKERS: No results for input(s): AFPTM, CEA, CA199, CHROMGRNA in the last 8760 hours.  Assessment and Plan: 32 y.o. female with history of nephrolithiasis including 2 large right lower pole renal calculi who presents today for right percutaneous nephrostomy/nephroureteral catheter placement prior to planned nephrolithotomy.Risks and benefits of procedure were discussed with the patient/family including, but not limited to, infection, bleeding, significant bleeding causing loss or decrease in renal function or damage to adjacent structures.  All of the patient's questions were answered, patient is agreeable to proceed.  Consent signed and in chart.   LABS PENDING    Thank you for this interesting consult.  I greatly enjoyed meeting Audrey L [place] and look forward to participating in their care.  A copy of this report was sent to the requesting provider on this date.  Electronically Signed: D. Jeananne Rama, PA-C 03/08/2018, 8:37 AM   I spent a total of  25 minutes   in face to face in clinical consultation, greater than 50% of which was counseling/coordinating care for right percutaneous nephrostomy/nephroureteral catheter placement

## 2018-03-09 ENCOUNTER — Encounter (HOSPITAL_COMMUNITY): Payer: Self-pay | Admitting: Urology

## 2018-03-09 DIAGNOSIS — N200 Calculus of kidney: Secondary | ICD-10-CM | POA: Diagnosis not present

## 2018-03-09 LAB — HEMOGLOBIN AND HEMATOCRIT, BLOOD
HCT: 27.7 % — ABNORMAL LOW (ref 36.0–46.0)
HEMATOCRIT: 25.1 % — AB (ref 36.0–46.0)
HEMOGLOBIN: 9 g/dL — AB (ref 12.0–15.0)
Hemoglobin: 8.1 g/dL — ABNORMAL LOW (ref 12.0–15.0)

## 2018-03-09 MED ORDER — OXYCODONE HCL 5 MG PO TABS: 5.0000 mg | ORAL_TABLET | ORAL | 0 refills | Status: DC | PRN

## 2018-03-09 MED ORDER — DEXTROSE-NACL 5-0.45 % IV SOLN
INTRAVENOUS | Status: AC
  Administered 2018-03-10 – 2018-03-11 (×5): via INTRAVENOUS

## 2018-03-09 MED ORDER — CEPHALEXIN 500 MG PO CAPS: 500.0000 mg | ORAL_CAPSULE | Freq: Two times a day (BID) | ORAL | 0 refills | Status: DC

## 2018-03-09 NOTE — Progress Notes (Signed)
1 Day Post-Op Subjective: Patient had clotted foley overnight. I had it removed. She has tolerated plugged pcn tube so far' Nauseous.  Objective: Vital signs in last 24 hours: Temp:  [97.5 F (36.4 C)-99.2 F (37.3 C)] 98.8 F (37.1 C) (08/23 0719) Pulse Rate:  [64-121] 85 (08/23 0719) Resp:  [10-24] 18 (08/23 0719) BP: (90-136)/(52-94) 93/52 (08/23 0719) SpO2:  [91 %-100 %] 100 % (08/23 0719)  Intake/Output from previous day: 08/22 0701 - 08/23 0700 In: 2860.9 [I.V.:2510.9; IV Piggyback:350] Out: 2225 [Urine:1625; Blood:600] Intake/Output this shift: No intake/output data recorded.  Physical Exam:  Constitutional: Vital signs reviewed. WD WN in NAD   Eyes: PERRL, No scleral icterus.   Cardiovascular: RRR Pulmonary/Chest: Normal effort Extremities: No cyanosis or edema   Lab Results: Recent Labs    03/08/18 0816 03/09/18 0403  HGB 12.5 9.0*  HCT 38.8 27.7*   BMET Recent Labs    03/08/18 0816  NA 136  K 4.4  CL 106  CO2 23  GLUCOSE 85  BUN 17  CREATININE 0.98  CALCIUM 8.8*   Recent Labs    03/08/18 0816  INR 0.90   No results for input(s): LABURIN in the last 72 hours. Results for orders placed or performed during the hospital encounter of 10/29/17  Urine culture     Status: Abnormal   Collection Time: 10/29/17  6:00 PM  Result Value Ref Range Status   Specimen Description   Final    URINE, CLEAN CATCH Performed at [facility], [address], [place], [place] [address]    Special Requests   Final    NONE Performed at [facility], [address] [place], [place] [address]    Culture MULTIPLE SPECIES PRESENT, SUGGEST RECOLLECTION (A)  Final   Report Status 10/31/2017 FINAL  Final    Studies/Results: Dg C-arm 1-60 Min-no Report  Result Date: 03/08/2018 Fluoroscopy was utilized by the requesting physician.  No radiographic interpretation.   Ir Ureteral Stent Placement Existing Access Right  Result  Date: 03/08/2018 INDICATION: Right nephrolithiasis, access for percutaneous nephrolithotomy EXAM: ULTRASOUND FLUOROSCOPIC RIGHT NEPHROSTOMY ACCESS COMPARISON:  10/29/2017 MEDICATIONS: ANCEF 2 G; The antibiotic was administered in an appropriate time frame prior to skin puncture. ANESTHESIA/SEDATION: Fentanyl 100 mcg IV; Versed 4 mg IV Moderate Sedation Time:  18 MINUTES The patient was continuously monitored during the procedure by the interventional radiology nurse under my direct supervision. CONTRAST:  15 CC ISOVUE-300-administered into the collecting system(s) FLUOROSCOPY TIME:  Fluoroscopy Time: 3 minutes 48 seconds (42 mGy). COMPLICATIONS: None immediate. PROCEDURE: Informed written consent was obtained from the patient after a thorough discussion of the procedural risks, benefits and alternatives. All questions were addressed. Maximal Sterile Barrier Technique was utilized including caps, mask, sterile gowns, sterile gloves, sterile drape, hand hygiene and skin antiseptic. A timeout was performed prior to the initiation of the procedure. previous imaging reviewed. preliminary ultrasound performed. the RIGHT lower pole calculi over localized. under sterile conditions and local anesthesia, an 18 gauge 15 cm access needle was advanced percutaneously under ultrasound and fluoroscopy to the slightly more superior lower pole calculus. needle position confirmed with contrast injection. 018 guidewire advanced centrally followed by the accustick dilator set. bentson guidewire coiled in the renal pelvis. kumpe catheter and bentson guidewire advanced down the ureter into the bladder. position confirmed in the bladder with contrast injection. images obtained for documentation. catheter secured with a silk suture. sterile dressing applied. no immediate complication. patient tolerated the procedure well.  IMPRESSION: SUCCESSFUL ULTRASOUND AND FLUOROSCOPIC RIGHT NEPHROSTOMY ACCESS PRIOR TO OPERATIVE NEPHROLITHOTOMY LATER  TODAY. Electronically Signed   By: Judie PetitM.  Shick M.D.   On: 03/08/2018 11:13    Assessment/Plan:   POD 1 rt pcnl. Fair amt of blood loss during surgery. Hct 27.7 now (preop 38.8).  Not ready for d/c. Will recheck hct this pm and in am. Will shoot for home 8.24. If she tolerates continued clamping ok to remove pc tube and d/c tomorrow.   LOS: 0 days   Chelsea AusStephen M Chesni Vos 03/09/2018, 8:29 AM

## 2018-03-09 NOTE — Progress Notes (Signed)
Patient vomited on self/bed. Bathed patient and changed linen/gown. Patient states she is feeling okay now. Continue to monitor.

## 2018-03-09 NOTE — Progress Notes (Signed)
Patient C/O feeling full and sensation to void with foley inplace, bladder scan -551cc notified Dr. Vernie Ammonsttelin, order given to hand irrigate foley. Foley irrigated, multiple medium clots out and 300cc of bloody urine out and foley continue to drain, will continue to assess patient, patient's nurse updated to F/U with plan of care.

## 2018-03-09 NOTE — Progress Notes (Addendum)
Patient c/o nauseous. Charge RN, Tressie StalkerIfy, went to see patient. Foley had little output during shift and patient's abdomen distended. Bladder scanned patient - greater 500ml shown.  Ify paged MD. New order to irrigate foley placed. Ify irrigated foley and noticed several clots. Foley currently draining. 600ml bloody urine containing clots was emptied. Continue to monitor.

## 2018-03-10 ENCOUNTER — Observation Stay (HOSPITAL_COMMUNITY): Payer: BLUE CROSS/BLUE SHIELD

## 2018-03-10 DIAGNOSIS — N200 Calculus of kidney: Secondary | ICD-10-CM | POA: Diagnosis not present

## 2018-03-10 LAB — CBC WITH DIFFERENTIAL/PLATELET
Basophils Absolute: 0 10*3/uL (ref 0.0–0.1)
Basophils Relative: 0 %
Eosinophils Absolute: 0 10*3/uL (ref 0.0–0.7)
Eosinophils Relative: 0 %
HCT: 29.2 % — ABNORMAL LOW (ref 36.0–46.0)
HEMOGLOBIN: 9.5 g/dL — AB (ref 12.0–15.0)
LYMPHS ABS: 1.1 10*3/uL (ref 0.7–4.0)
LYMPHS PCT: 7 %
MCH: 27.1 pg (ref 26.0–34.0)
MCHC: 32.5 g/dL (ref 30.0–36.0)
MCV: 83.2 fL (ref 78.0–100.0)
Monocytes Absolute: 1.4 10*3/uL — ABNORMAL HIGH (ref 0.1–1.0)
Monocytes Relative: 9 %
NEUTROS ABS: 12.7 10*3/uL — AB (ref 1.7–7.7)
NEUTROS PCT: 84 %
Platelets: 196 10*3/uL (ref 150–400)
RBC: 3.51 MIL/uL — AB (ref 3.87–5.11)
RDW: 14.7 % (ref 11.5–15.5)
WBC: 15.2 10*3/uL — AB (ref 4.0–10.5)

## 2018-03-10 LAB — PREPARE RBC (CROSSMATCH)

## 2018-03-10 LAB — BASIC METABOLIC PANEL
ANION GAP: 9 (ref 5–15)
BUN: 5 mg/dL — AB (ref 6–20)
CHLORIDE: 103 mmol/L (ref 98–111)
CO2: 26 mmol/L (ref 22–32)
Calcium: 8.4 mg/dL — ABNORMAL LOW (ref 8.9–10.3)
Creatinine, Ser: 0.74 mg/dL (ref 0.44–1.00)
GFR calc Af Amer: >60 mL/min (ref >60)
GFR calc non Af Amer: >60 mL/min (ref >60)
GLUCOSE: 125 mg/dL — AB (ref 70–99)
POTASSIUM: 3.5 mmol/L (ref 3.5–5.1)
SODIUM: 138 mmol/L (ref 135–145)

## 2018-03-10 LAB — PROTIME-INR
INR: 1.07
Prothrombin Time: 13.8 seconds (ref 11.4–15.2)

## 2018-03-10 LAB — HEMOGLOBIN AND HEMATOCRIT, BLOOD
HEMATOCRIT: 23.8 % — AB (ref 36.0–46.0)
HEMOGLOBIN: 7.6 g/dL — AB (ref 12.0–15.0)

## 2018-03-10 LAB — LACTIC ACID, PLASMA: LACTIC ACID, VENOUS: 1.1 mmol/L (ref 0.5–1.9)

## 2018-03-10 LAB — APTT: aPTT: 30 seconds (ref 24–36)

## 2018-03-10 LAB — PROCALCITONIN: PROCALCITONIN: 4.44 ng/mL

## 2018-03-10 MED ORDER — SODIUM CHLORIDE 0.9 % IV SOLN: 2.0000 g | Freq: Once | INTRAVENOUS | Status: DC

## 2018-03-10 MED ORDER — SODIUM CHLORIDE 0.9 % IV SOLN
2.0000 g | Freq: Two times a day (BID) | INTRAVENOUS | Status: DC
  Administered 2018-03-10 – 2018-03-12 (×5): 2 g via INTRAVENOUS
  Filled 2018-03-10 (×6): qty 2

## 2018-03-10 MED ORDER — SODIUM CHLORIDE 0.9% IV SOLUTION
Freq: Once | INTRAVENOUS | Status: AC
  Administered 2018-03-10: 16:00:00 via INTRAVENOUS

## 2018-03-10 NOTE — Progress Notes (Signed)
Pt continues to complain of back pain. Urology saw patient and hooked R nephrostomy tube to drainage bag. Pt stated some relief after the tube was unhooked. Pt was given 1 unit of PRBC.

## 2018-03-10 NOTE — Progress Notes (Signed)
Pt BP's soft 98/56 Manual @ 2225. Pt lethargic and somnolent. Hgb was 8.1@ 1413. On call Urology contacted . New order to increase 5% Dex 1/2 NS from 75 mls/hr to 150 mls/hr.

## 2018-03-10 NOTE — Progress Notes (Signed)
2 Days Post-Op Subjective: Patient reports right flank pain, lethargy, dizziness. Lying still in bed. Hgb down to 7.6 and required increase IVF last night.   Objective: Vital signs in last 24 hours: Temp:  [99.1 F (37.3 C)-101.4 F (38.6 C)] 101.4 F (38.6 C) (08/24 1339) Pulse Rate:  [86-101] 101 (08/24 1339) Resp:  [14-20] 20 (08/24 1339) BP: (96-108)/(53-68) 108/66 (08/24 1339) SpO2:  [97 %-99 %] 97 % (08/24 1339)  Intake/Output from previous day: 08/23 0701 - 08/24 0700 In: 2485.8 [P.O.:60; I.V.:2275.8; IV Piggyback:150] Out: 900 [Urine:900] Intake/Output this shift: Total I/O In: 110 [P.O.:60; IV Piggyback:50] Out: 500 [Urine:500]  Physical Exam:  NAD Appears lethargic Neuro - no focal deficits CV - tachy, RRR Resp - reg effort and depth Abd - soft, NT Right Nx - connected to gravity drainage - light red, looks clear. Flushed with sterile saline and flushes normally.   Lab Results: Recent Labs    03/09/18 0403 03/09/18 1413 03/10/18 0417  HGB 9.0* 8.1* 7.6*  HCT 27.7* 25.1* 23.8*   BMET Recent Labs    03/08/18 0816  NA 136  K 4.4  CL 106  CO2 23  GLUCOSE 85  BUN 17  CREATININE 0.98  CALCIUM 8.8*   Recent Labs    03/08/18 0816  INR 0.90   No results for input(s): LABURIN in the last 72 hours. Results for orders placed or performed during the hospital encounter of 10/29/17  Urine culture     Status: Abnormal   Collection Time: 10/29/17  6:00 PM  Result Value Ref Range Status   Specimen Description   Final    URINE, CLEAN CATCH Performed at [facility], [address] [place], [place] [address]    Special Requests   Final    NONE Performed at [facility], [address] [place], [place] [address]    Culture MULTIPLE SPECIES PRESENT, SUGGEST RECOLLECTION (A)  Final   Report Status 10/31/2017 FINAL  Final    Studies/Results: Dg C-arm 1-60 Min-no Report  Result Date: 03/08/2018 Fluoroscopy was  utilized by the requesting physician.  No radiographic interpretation.   Last office culture 02/26/2018: Antimicrobial Susceptibility and Organism Identification Report  FINAL  Source : URINE CULTURE Iso/Result : 01 Escherichia coli #1 Antimicrobic/Dose MIC SYSTEMIC URINE --------------------------------------------------------------- Amp/Sulbactam <8/4 S S Amikacin <16 S S Ampicillin >16 R R Amox/K Clav <8/4 S S Aztreonam <4 S S Ceftriaxone 2 I I Ceftazidime <1 S S Cefotaxime <2 S S Cefazolin <2 S S Ciprofloxacin <1 S S Cefepime <2 S S Cefotetan <16 S S Ertapenem <0.5 S S Nitrofurantoin <32 S Gentamicin <4 S S Levofloxacin <2 S S Meropenem <1 S S Pip/Tazo <16 S S Trimeth/Sulfa <2/38 S S Tetracycline <4 S S Tobramycin <4 S S   Assessment/Plan:  POD#2 right PCNL -   Right flank pain, low grade temp - I sent urine for culture from Nx tube and connected it back to gravity drainage. Will continue cefazolin for now. Last hospital cx negative, office cx was e coli sensitive to cefazolin.   Anemia - symptomatic - discussed with patient the nature r/b/a to blood transfusion and she gave verbal consent for blood transfusion   LOS: 0 days   Jerilee FieldMatthew Esme Durkin 03/10/2018, 1:49 PM

## 2018-03-10 NOTE — Plan of Care (Addendum)
Pt unable to take in nourishment d/t nausea. Pt BP's have continued to be soft.  96/58 @ 0057.

## 2018-03-10 NOTE — Progress Notes (Signed)
Pharmacy Antibiotic Note  [place] Audrey Gonzalez is a 32 y.o. female with hx nephrolithiasis admitted [facility] 03/08[facility]2018 for right percutaneous nephrostomy/nephroureteral catheter placement.  She was started on ancef post procedure. Patient c/o right frank pain, AMS and fever on 8/24 -- To change abx to cefepime for UTI.  - scr 0.74 (crcl~93)  Plan: - d/c ancef - cefepime 2 gm IV q12h  ________________________________  Height: 5\' 6"  (167.6 cm) Weight: 155 lb (70.3 kg) IBW/kg (Calculated) : 59.3  Temp (24hrs), Avg:100 F (37.8 C), Min:99.1 F (37.3 C), Max:101.8 F (38.8 C)  Recent Labs  Lab 03/05/18 0830 03/08/18 0816 03/10/18 1956  WBC 5.2 3.9* 15.2*  CREATININE  --  0.98 0.74    Estimated Creatinine Clearance: 93.6 mL/min (by C-G formula based on SCr of 0.74 mg/dL).    No Known Allergies  Antimicrobials this admission:  8/22 ancef>> 8/24 8/24 cefepime>>  Dose adjustments this admission:  --  Microbiology results:  8/24 UCx:    Thank you for allowing pharmacy to be a part of this patient's care.  Lucia Gaskinsham, Nashae Maudlin P 03/10/2018 9:11 PM

## 2018-03-10 NOTE — Progress Notes (Signed)
   Pt h/h much improved and HR, blood pressure stable. Temp up to 101.8, so I did initiate the code sepsis bundle. Change abx to cefepime per protocol. Spoke with nurse.

## 2018-03-10 NOTE — Progress Notes (Signed)
Pt complaining of continued nausea. Pt only able to eat 25% of breakfast. Pt having weakness and dizziness when ambulating out of bed.

## 2018-03-11 DIAGNOSIS — N200 Calculus of kidney: Secondary | ICD-10-CM | POA: Diagnosis not present

## 2018-03-11 LAB — BPAM RBC
Blood Product Expiration Date: 201909192359
ISSUE DATE / TIME: 201908241541
Unit Type and Rh: 9500

## 2018-03-11 LAB — LACTIC ACID, PLASMA: Lactic Acid, Venous: 1.1 mmol/L (ref 0.5–1.9)

## 2018-03-11 LAB — TYPE AND SCREEN
ABO/RH(D): O NEG
ANTIBODY SCREEN: NEGATIVE
UNIT DIVISION: 0

## 2018-03-11 LAB — CBC
HEMATOCRIT: 25.5 % — AB (ref 36.0–46.0)
HEMOGLOBIN: 8.5 g/dL — AB (ref 12.0–15.0)
MCH: 27.5 pg (ref 26.0–34.0)
MCHC: 33.3 g/dL (ref 30.0–36.0)
MCV: 82.5 fL (ref 78.0–100.0)
Platelets: 193 10*3/uL (ref 150–400)
RBC: 3.09 MIL/uL — ABNORMAL LOW (ref 3.87–5.11)
RDW: 14.6 % (ref 11.5–15.5)
WBC: 14.2 10*3/uL — ABNORMAL HIGH (ref 4.0–10.5)

## 2018-03-11 LAB — URINE CULTURE: Culture: NO GROWTH

## 2018-03-11 NOTE — Progress Notes (Signed)
3 Days Post-Op Subjective: Patient reports she is feeling better but still a little bit dizzy.  She is tolerating a regular diet and passing flatus.  Objective: Vital signs in last 24 hours: Temp:  [99.1 F (37.3 C)-101.8 F (38.8 C)] 99.8 F (37.7 C) (08/25 0413) Pulse Rate:  [84-100] 92 (08/25 0413) Resp:  [15-20] 15 (08/25 0413) BP: (101-122)/(61-76) 119/74 (08/25 0413) SpO2:  [97 %-100 %] 97 % (08/25 0413)  Intake/Output from previous day: 08/24 0701 - 08/25 0700 In: 2287 [P.O.:60; I.V.:1812; Blood:315; IV Piggyback:100] Out: 2080 [Urine:2080] Intake/Output this shift: Total I/O In: 100 [IV Piggyback:100] Out: 850 [Urine:850]  Physical Exam:  No acute distress, looks a little more lively today Watching TV Abdomen soft and nontender Right nephrostomy tube with good urine output and clear urine  Lab Results: Recent Labs    03/10/18 0417 03/10/18 1956 03/11/18 1156  HGB 7.6* 9.5* 8.5*  HCT 23.8* 29.2* 25.5*   BMET Recent Labs    03/10/18 1956  NA 138  K 3.5  CL 103  CO2 26  GLUCOSE 125*  BUN 5*  CREATININE 0.74  CALCIUM 8.4*   Recent Labs    03/10/18 2222  INR 1.07   No results for input(s): LABURIN in the last 72 hours. Results for orders placed or performed during the hospital encounter of 10/29/17  Urine culture     Status: Abnormal   Collection Time: 10/29/17  6:00 PM  Result Value Ref Range Status   Specimen Description   Final    URINE, CLEAN CATCH Performed at [facility], [address] [place], [place] [address]    Special Requests   Final    NONE Performed at [facility], [address] [place], [place] [address]    Culture MULTIPLE SPECIES PRESENT, SUGGEST RECOLLECTION (A)  Final   Report Status 10/31/2017 FINAL  Final    Studies/Results: Dg Chest Port 1 View  Result Date: 03/10/2018 CLINICAL DATA:  Sepsis EXAM: PORTABLE CHEST 1 VIEW COMPARISON:  Chest x-ray dated 12/09/2015. FINDINGS:  Study is hypoinspiratory with crowding of the perihilar and bibasilar bronchovascular markings. Probable mild central pulmonary vascular congestion and interstitial edema. Probable small bilateral pleural effusions. Heart size and mediastinal contours are within normal limits given the low lung volumes. Osseous structures about the chest are unremarkable. IMPRESSION: Low lung volumes. Probable mild pulmonary vascular congestion and interstitial edema. Probable small bilateral pleural effusions. No convincing evidence of pneumonia. Electronically Signed   By: Bary RichardStan  Maynard M.D.   On: 03/10/2018 21:24    Assessment/Plan:  Postop day 3 right PCNL with postop acute blood loss anemia and fever-  Continue IV antibiotics (cefepime)-blood and urine cultures pending  We will leave right nephrostomy tube to gravity drainage  Hemoglobin at 8.5 so we will continue to monitor  Her pressures are better so I will saline lock   LOS: 0 days   Jerilee FieldMatthew An Schnabel 03/11/2018, 1:54 PM

## 2018-03-12 DIAGNOSIS — Z8249 Family history of ischemic heart disease and other diseases of the circulatory system: Secondary | ICD-10-CM | POA: Diagnosis not present

## 2018-03-12 DIAGNOSIS — D62 Acute posthemorrhagic anemia: Secondary | ICD-10-CM | POA: Diagnosis not present

## 2018-03-12 DIAGNOSIS — N200 Calculus of kidney: Secondary | ICD-10-CM | POA: Diagnosis present

## 2018-03-12 DIAGNOSIS — R5082 Postprocedural fever: Secondary | ICD-10-CM | POA: Diagnosis not present

## 2018-03-12 DIAGNOSIS — Z8261 Family history of arthritis: Secondary | ICD-10-CM | POA: Diagnosis not present

## 2018-03-12 LAB — CBC
HCT: 27.4 % — ABNORMAL LOW (ref 36.0–46.0)
Hemoglobin: 8.9 g/dL — ABNORMAL LOW (ref 12.0–15.0)
MCH: 26.9 pg (ref 26.0–34.0)
MCHC: 32.5 g/dL (ref 30.0–36.0)
MCV: 82.8 fL (ref 78.0–100.0)
PLATELETS: 243 10*3/uL (ref 150–400)
RBC: 3.31 MIL/uL — AB (ref 3.87–5.11)
RDW: 14.4 % (ref 11.5–15.5)
WBC: 13.6 10*3/uL — AB (ref 4.0–10.5)

## 2018-03-12 LAB — BASIC METABOLIC PANEL
ANION GAP: 9 (ref 5–15)
BUN: 6 mg/dL (ref 6–20)
CALCIUM: 8.7 mg/dL — AB (ref 8.9–10.3)
CO2: 26 mmol/L (ref 22–32)
Chloride: 104 mmol/L (ref 98–111)
Creatinine, Ser: 0.63 mg/dL (ref 0.44–1.00)
GFR calc Af Amer: >60 mL/min (ref >60)
Glucose, Bld: 96 mg/dL (ref 70–99)
Potassium: 3.1 mmol/L — ABNORMAL LOW (ref 3.5–5.1)
SODIUM: 139 mmol/L (ref 135–145)

## 2018-03-12 LAB — GLUCOSE, CAPILLARY: GLUCOSE-CAPILLARY: 110 mg/dL — AB (ref 70–99)

## 2018-03-12 MED ORDER — DIPHENHYDRAMINE HCL 25 MG PO CAPS
25.0000 mg | ORAL_CAPSULE | Freq: Four times a day (QID) | ORAL | Status: DC | PRN
  Administered 2018-03-12: 25 mg via ORAL
  Filled 2018-03-12: qty 1

## 2018-03-12 MED ORDER — SODIUM CHLORIDE 0.9 % IV SOLN
INTRAVENOUS | Status: DC | PRN
  Administered 2018-03-12: 250 mL via INTRAVENOUS

## 2018-03-12 NOTE — Progress Notes (Signed)
RN ambulated patient approx. 20 ft. Pt complained of dizziness and weakness. Doctor ordered PT eval.

## 2018-03-12 NOTE — Progress Notes (Signed)
4 Days Post-Op Subjective: Patient reports feeling weak. No BM yet but does not feel constipated.  Objective: Vital signs in last 24 hours: Temp:  [98.9 F (37.2 C)-99.8 F (37.7 C)] 99.2 F (37.3 C) (08/26 0428) Pulse Rate:  [82-87] 82 (08/26 0428) Resp:  [12-15] 12 (08/26 0428) BP: (102-108)/(66-72) 106/72 (08/26 0428) SpO2:  [97 %-100 %] 100 % (08/26 0428)  Intake/Output from previous day: 08/25 0701 - 08/26 0700 In: 557 [P.O.:457; IV Piggyback:100] Out: 1525 [Urine:1525] Intake/Output this shift: Total I/O In: -  Out: 250 [Urine:250]  Physical Exam:  Constitutional: Vital signs reviewed. WD WN in NAD   Eyes: PERRL, No scleral icterus.   Cardiovascular: RRR Pulmonary/Chest: Normal effort   Lab Results: Recent Labs    03/10/18 1956 03/11/18 1156 03/12/18 0433  HGB 9.5* 8.5* 8.9*  HCT 29.2* 25.5* 27.4*   BMET Recent Labs    03/10/18 1956 03/12/18 0433  NA 138 139  K 3.5 3.1*  CL 103 104  CO2 26 26  GLUCOSE 125* 96  BUN 5* 6  CREATININE 0.74 0.63  CALCIUM 8.4* 8.7*   Recent Labs    03/10/18 2222  INR 1.07   No results for input(s): LABURIN in the last 72 hours. Results for orders placed or performed during the hospital encounter of 03/08/18  Culture, Urine     Status: None   Collection Time: 03/10/18  1:58 PM  Result Value Ref Range Status   Specimen Description   Final    URINE, CATHETERIZED Performed at [facility], [address] [place], [place] [address]    Special Requests   Final    NONE Performed at [facility], [address] [place], [place] [address]    Culture   Final    NO GROWTH Performed at [facility], [address] [place], [place] [address]    Report Status 03/11/2018 FINAL  Final    Studies/Results: Dg Chest Port 1 View  Result Date: 03/10/2018 CLINICAL DATA:  Sepsis EXAM: PORTABLE CHEST 1 VIEW COMPARISON:  Chest x-ray dated 12/09/2015. FINDINGS: Study is  hypoinspiratory with crowding of the perihilar and bibasilar bronchovascular markings. Probable mild central pulmonary vascular congestion and interstitial edema. Probable small bilateral pleural effusions. Heart size and mediastinal contours are within normal limits given the low lung volumes. Osseous structures about the chest are unremarkable. IMPRESSION: Low lung volumes. Probable mild pulmonary vascular congestion and interstitial edema. Probable small bilateral pleural effusions. No convincing evidence of pneumonia. Electronically Signed   By: Bary RichardStan  Maynard M.D.   On: 03/10/2018 21:24    Assessment/Plan:   POD 4 Rt PCNL. Had postop fever cultures neg so far. Was on preop abx.  Will get OOB, plug n tube. Hopefully home 8.27.   LOS: 0 days   Bertram MillardStephen M Tymia Streb 03/12/2018, 10:05 AM

## 2018-03-13 ENCOUNTER — Encounter (HOSPITAL_COMMUNITY): Payer: Self-pay | Admitting: Urology

## 2018-03-13 NOTE — Discharge Instructions (Signed)

## 2018-03-13 NOTE — Progress Notes (Signed)
5 Days Post-Op  Subjective: Audrey Gonzalez is 5 days out from a right PCNL.  She continues to report some pain.  Urine from NT clear.  Very flat, depressed affect.   ROS:  Review of Systems  Constitutional: Negative for fever.  Gastrointestinal: Negative for nausea.    Anti-infectives: Anti-infectives (From admission, onward)   Start     Dose/Rate Route Frequency Ordered Stop   03/10/18 2130  ceFEPIme (MAXIPIME) 2 g in sodium chloride 0.9 % 100 mL IVPB     2 g 200 mL/hr over 30 Minutes Intravenous Every 12 hours 03/10/18 2120     03/10/18 2115  ceFEPIme (MAXIPIME) 2 g in sodium chloride 0.9 % 100 mL IVPB  Status:  Discontinued     2 g 200 mL/hr over 30 Minutes Intravenous  Once 03/10/18 2100 03/10/18 2120   03/09/18 0000  cephALEXin (KEFLEX) 500 MG capsule     500 mg Oral 2 times daily 03/09/18 0835     03/08/18 1800  ceFAZolin (ANCEF) IVPB 1 g/50 mL premix  Status:  Discontinued     1 g 100 mL/hr over 30 Minutes Intravenous Every 8 hours 03/08/18 1611 03/10/18 2119   03/08/18 0830  ceFAZolin (ANCEF) IVPB 2g/100 mL premix     2 g 200 mL/hr over 30 Minutes Intravenous To Radiology 03/08/18 0755 03/08/18 1118   03/08/18 0756  ceFAZolin (ANCEF) IVPB 2g/100 mL premix  Status:  Discontinued     2 g 200 mL/hr over 30 Minutes Intravenous 30 min pre-op 03/08/18 0756 03/08/18 0756      Current Facility-Administered Medications  Medication Dose Route Frequency Provider Last Rate Last Dose  . 0.9 %  sodium chloride infusion   Intravenous PRN Marcine Matarahlstedt, Stephen, MD   Stopped at 03/12/18 1339  . acetaminophen (TYLENOL) tablet 650 mg  650 mg Oral Q4H PRN Marcine Matarahlstedt, Stephen, MD   650 mg at 03/10/18 2014  . ceFEPIme (MAXIPIME) 2 g in sodium chloride 0.9 % 100 mL IVPB  2 g Intravenous Q12H Pham, Anh P, RPH 200 mL/hr at 03/12/18 2103 2 g at 03/12/18 2103  . diphenhydrAMINE (BENADRYL) capsule 25 mg  25 mg Oral Q6H PRN Marcine Matarahlstedt, Stephen, MD   25 mg at 03/12/18 1640  . HYDROmorphone (DILAUDID) injection  0.5-1 mg  0.5-1 mg Intravenous Q2H PRN Marcine Matarahlstedt, Stephen, MD   1 mg at 03/12/18 1726  . ondansetron (ZOFRAN) injection 4 mg  4 mg Intravenous Q4H PRN Marcine Matarahlstedt, Stephen, MD   4 mg at 03/12/18 1355  . oxybutynin (DITROPAN) tablet 5 mg  5 mg Oral Q8H PRN Marcine Matarahlstedt, Stephen, MD      . oxyCODONE (Oxy IR/ROXICODONE) immediate release tablet 5 mg  5 mg Oral Q4H PRN Marcine Matarahlstedt, Stephen, MD   5 mg at 03/13/18 0026  . senna (SENOKOT) tablet 8.6 mg  1 tablet Oral BID Marcine Matarahlstedt, Stephen, MD   8.6 mg at 03/12/18 0958  . zolpidem (AMBIEN) tablet 5 mg  5 mg Oral QHS PRN Marcine Matarahlstedt, Stephen, MD         Objective: Vital signs in last 24 hours: Temp:  [98.1 F (36.7 C)-99 F (37.2 C)] 98.2 F (36.8 C) (08/27 [id number]) Pulse Rate:  [75-86] 86 (08/27 0638) Resp:  [16-20] 20 (08/27 [id number]) BP: (100-111)/(58-77) 102/77 (08/27 [id number]) SpO2:  [98 %-100 %] 100 % (08/27 [id number])  Intake/Output from previous day: 08/26 0701 - 08/27 0700 In: 568.6 [P.O.:240; I.V.:28.6; IV Piggyback:300] Out: 1350 [Urine:1350] Intake/Output this shift: No intake/output data recorded.   Physical  Exam  Constitutional: She appears well-developed and well-nourished.  Cardiovascular: Normal rate, regular rhythm and normal heart sounds.  Pulmonary/Chest: Effort normal and breath sounds normal. No respiratory distress.  Abdominal: Soft. There is tenderness (right flank).  NT site intact.   Tube removed and dressing replaced.   Musculoskeletal: Normal range of motion. She exhibits no edema or tenderness.  Psychiatric:  Mood very flat  Vitals reviewed.   Lab Results:  Recent Labs    03/11/18 1156 03/12/18 0433  WBC 14.2* 13.6*  HGB 8.5* 8.9*  HCT 25.5* 27.4*  PLT 193 243   BMET Recent Labs    03/10/18 1956 03/12/18 0433  NA 138 139  K 3.5 3.1*  CL 103 104  CO2 26 26  GLUCOSE 125* 96  BUN 5* 6  CREATININE 0.74 0.63  CALCIUM 8.4* 8.7*   PT/INR Recent Labs    03/10/18 2222  LABPROT 13.8  INR 1.07   ABG No results  for input(s): PHART, HCO3 in the last 72 hours.  Invalid input(s): PCO2, PO2  Studies/Results: No results found.   Assessment and Plan: S/P right PCNL.  Tube removed.  Will get IV out.  Plan for discharge later today.       LOS: 1 day    Audrey Gonzalez 03/13/2018 [phone] ID: Audrey Gonzalez, female   DOB: Apr 04, 1986, 32 y.o.   MRN: [id number]

## 2018-03-13 NOTE — Progress Notes (Signed)
03/13/18  0845  Reviewed discharge instructions with patient. Patient verbalized understanding of discharge instructions. Copy of discharge instructions and prescriptions given to patient. Patient is calling for a transportation home.

## 2018-03-13 NOTE — Plan of Care (Signed)
  Problem: Safety: Goal: Ability to remain free from injury will improve Outcome: Progressing   Problem: Education: Goal: Knowledge of General Education information will improve Description Including pain rating scale, medication(s)/side effects and non-pharmacologic comfort measures Outcome: Progressing   

## 2018-03-13 NOTE — Progress Notes (Signed)
03/13/18  [id number]  Patient states she is waiting for her uncle to call her back to arrange transportation home.

## 2018-03-13 NOTE — Progress Notes (Signed)
Taking over care of patient. Agree with previous RN assessment. Will continue to monitor until patients ride arrives for D/C.

## 2018-03-16 LAB — CULTURE, BLOOD (ROUTINE X 2)
CULTURE: NO GROWTH
Culture: NO GROWTH
SPECIAL REQUESTS: ADEQUATE
Special Requests: ADEQUATE

## 2018-03-21 NOTE — Discharge Summary (Signed)
Patient ID: Audrey Gonzalez MRN: [id number] DOB/AGE: September 03, 1985 32 y.o.  Admit date: 03/08/2018 Discharge date: 03/21/2018  Primary Care Physician:  Lucky Cowboy, MD  Discharge Diagnoses:  Right renal calculi, postoperative fever Admission diagnoses:    Right renal calculi Consults: none   Discharge Medications: Allergies as of 03/13/2018   No Known Allergies     Medication List    TAKE these medications   cephALEXin 500 MG capsule Commonly known as:  KEFLEX Take 1 capsule (500 mg total) by mouth 2 (two) times daily.   ibuprofen 600 MG tablet Commonly known as:  ADVIL,MOTRIN Take 1 tablet (600 mg total) by mouth every 6 (six) hours as needed. What changed:  reasons to take this   oxyCODONE 5 MG immediate release tablet Commonly known as:  Oxy IR/ROXICODONE Take 1 tablet (5 mg total) by mouth every 4 (four) hours as needed for moderate pain.        Significant Diagnostic Studies:  Dg C-arm 1-60 Min-no Report  Result Date: 03/08/2018 Fluoroscopy was utilized by the requesting physician.  No radiographic interpretation.   Ir Ureteral Stent Placement Existing Access Right  Result Date: 03/08/2018 INDICATION: Right nephrolithiasis, access for percutaneous nephrolithotomy EXAM: ULTRASOUND FLUOROSCOPIC RIGHT NEPHROSTOMY ACCESS COMPARISON:  10/29/2017 MEDICATIONS: ANCEF 2 G; The antibiotic was administered in an appropriate time frame prior to skin puncture. ANESTHESIA/SEDATION: Fentanyl 100 mcg IV; Versed 4 mg IV Moderate Sedation Time:  18 MINUTES The patient was continuously monitored during the procedure by the interventional radiology nurse under my direct supervision. CONTRAST:  15 CC ISOVUE-300-administered into the collecting system(s) FLUOROSCOPY TIME:  Fluoroscopy Time: 3 minutes 48 seconds (42 mGy). COMPLICATIONS: None immediate. PROCEDURE: Informed written consent was obtained from the patient after a thorough discussion of the procedural risks, benefits and  alternatives. All questions were addressed. Maximal Sterile Barrier Technique was utilized including caps, mask, sterile gowns, sterile gloves, sterile drape, hand hygiene and skin antiseptic. A timeout was performed prior to the initiation of the procedure. previous imaging reviewed. preliminary ultrasound performed. the RIGHT lower pole calculi over localized. under sterile conditions and local anesthesia, an 18 gauge 15 cm access needle was advanced percutaneously under ultrasound and fluoroscopy to the slightly more superior lower pole calculus. needle position confirmed with contrast injection. 018 guidewire advanced centrally followed by the accustick dilator set. bentson guidewire coiled in the renal pelvis. kumpe catheter and bentson guidewire advanced down the ureter into the bladder. position confirmed in the bladder with contrast injection. images obtained for documentation. catheter secured with a silk suture. sterile dressing applied. no immediate complication. patient tolerated the procedure well. IMPRESSION: SUCCESSFUL ULTRASOUND AND FLUOROSCOPIC RIGHT NEPHROSTOMY ACCESS PRIOR TO OPERATIVE NEPHROLITHOTOMY LATER TODAY. Electronically Signed   By: Judie Petit.  Shick M.D.   On: 03/08/2018 11:13    Brief H and P: For complete details please refer to admission H and P, but in brief the patient is admitted for percutaneous management of large right lower pole calculi  Hospital Course: The patientiinitially had percutaneous access to her right kidney on 03/08/2018.  Following this, she went to the operating room where nephrolithotomy was performed through this access.  She tolerated the procedure well.   She did spike a fever postoperatively, despite preoperative and intraoperative/postoperative antibiotic management.  This eventually resolved.  The patient was resistant to get out of bed and ambulate, but this was eventually successful.  Patient's fever decreased, her nephrostomy tube was capped and eventually  removed.  She was discharged home on  8.27.2019 and improved condition  Day of Discharge BP 102/77 (BP Location: Right Arm)   Pulse 86   Temp 98.2 F (36.8 C) (Oral)   Resp 20   Ht 5\' 6"  (1.676 m)   Wt 70.3 kg   LMP 03/04/2018 (Exact Date)   SpO2 100%   BMI 25.02 kg/m   No results found for this or any previous visit (from the past 24 hour(s)).  Physical Exam: General: Alert and awake oriented x3 not in any acute distress. HEENT: anicteric sclera, pupils reactive to light and accommodation CVS: S1-S2 clear no murmur rubs or gallops Chest: clear to auscultation bilaterally, no wheezing rales or rhonchi Abdomen: soft nontender, nondistended, normal bowel sounds, no organomegaly Extremities: no cyanosis, clubbing or edema noted bilaterally Neuro: Cranial nerves II-XII intact, no focal neurological deficits  Disposition:  home  Diet:  regular  Activity:  Gradually increase, discussed with the patient   Disposition and Follow-up:  Follow-up appointments scheduled  TESTS THAT NEED FOLLOW-UP  Stone analysis  DISCHARGE FOLLOW-UP Follow-up Information    Burman Foster, NP.   Specialty:  Urology Why:  9/5 @ 8 am Contact information: [address] [place] [address] [phone]           Time spent on Discharge: 15 minutes  Signed: Bertram Millard Conn Trombetta 03/21/2018, 10:40 AM

## 2018-12-10 ENCOUNTER — Emergency Department (HOSPITAL_COMMUNITY)
Admission: EM | Admit: 2018-12-10 | Discharge: 2018-12-10 | Disposition: A | Discharge status: Home / Self Care | Payer: BLUE CROSS/BLUE SHIELD | Attending: Emergency Medicine | Admitting: Emergency Medicine

## 2018-12-10 ENCOUNTER — Other Ambulatory Visit: Payer: Self-pay

## 2018-12-10 ENCOUNTER — Emergency Department (HOSPITAL_COMMUNITY): Payer: BLUE CROSS/BLUE SHIELD

## 2018-12-10 ENCOUNTER — Encounter (HOSPITAL_COMMUNITY): Payer: Self-pay

## 2018-12-10 DIAGNOSIS — M25512 Pain in left shoulder: Secondary | ICD-10-CM | POA: Insufficient documentation

## 2018-12-10 MED ORDER — CYCLOBENZAPRINE HCL 10 MG PO TABS
5.0000 mg | ORAL_TABLET | Freq: Once | ORAL | Status: AC
  Administered 2018-12-10: 08:00:00 5 mg via ORAL
  Filled 2018-12-10: qty 1

## 2018-12-10 MED ORDER — OXYCODONE-ACETAMINOPHEN 5-325 MG PO TABS
1.0000 | ORAL_TABLET | Freq: Once | ORAL | Status: AC
  Administered 2018-12-10: 1 via ORAL
  Filled 2018-12-10: qty 1

## 2018-12-10 MED ORDER — ACETAMINOPHEN 325 MG PO TABS
650.0000 mg | ORAL_TABLET | Freq: Once | ORAL | Status: AC
  Administered 2018-12-10: 08:00:00 650 mg via ORAL
  Filled 2018-12-10: qty 2

## 2018-12-10 NOTE — ED Notes (Signed)
[place], Md at bedside.

## 2018-12-10 NOTE — ED Provider Notes (Signed)
[facility]-EMERGENCY DEPT Provider Note   CSN: [id number] Arrival date & time: 12/10/18  0703    History   Chief Complaint Chief Complaint  Patient presents with  . Shoulder Pain    left    HPI Audrey Gonzalez is a 33 y.o. female.     HPI Patient is a 33 year old female presents the emergency department with worsening anterior left shoulder pain over the past 48 hours.  She is right-handed.  She does not remember a distinct injury to her left shoulder.  She reports pain with flexion and abduction at the left shoulder.  Denies fevers and chills.  No weakness or numbness of the left upper extremity.  Denies chest pain.  No breast pain.  No back pain.  Pain is worse with movement and palpation of anterior shoulder.  Pain is moderate in severity.  She states that the sharp throbbing pain. no rash.   Past Medical History:  Diagnosis Date  . Anemia    during pregnancy  . Arthritis    SEEING RHEUMATOLOGIST TO DETERMINE TREATMENT; PRESENT WITH VISIBLE SWELLING OF SMALL JOINTS IN HANDS   . Bilateral swelling of feet    REPORTS MAY BE DUE TO ARTHRITIS ; CURENTLY SEEING RHEUM TO DETERMINE TREATMENT   . History of kidney stones   . Kidney stones    had multiple stones during pregnancy, followed by [facility]  . Migraines    HAD ONE LAST NIGHT ; MOSTLY OCCURS IF SHE GOES LONG PERIODS WITHOUT EATIING     Patient Active Problem List   Diagnosis Date Noted  . Calculus of kidney 11/24/2017  . Ureteral stone 06/03/2015    Past Surgical History:  Procedure Laterality Date  . CYSTOSCOPY WITH RETROGRADE PYELOGRAM, URETEROSCOPY AND STENT PLACEMENT Right 06/04/2015   Procedure: CYSTOSCOPY WITH  RIGHT RETROGRADE PYELOGRAM,RIGHT URETEROSCOPY , LASER LITHOTRIPSY AND BASKET EXTRACTION OF STONE;  Surgeon: Marcine Matar, MD;  Location: [facility];  Service: Urology;  Laterality: Right;  . CYSTOSCOPY WITH RETROGRADE PYELOGRAM, URETEROSCOPY AND STENT PLACEMENT Right  11/24/2017   Procedure: CYSTOSCOPY WITH RIGHT  RETROGRADE RIGHT URETEROSCOPY, RIGHT STENT PLACEMENT;  Surgeon: Marcine Matar, MD;  Location: [facility];  Service: Urology;  Laterality: Right;  . HOLMIUM LASER APPLICATION Right 06/04/2015   Procedure: HOLMIUM LASER APPLICATION;  Surgeon: Marcine Matar, MD;  Location: [facility];  Service: Urology;  Laterality: Right;  . IR URETERAL STENT PLACEMENT EXISTING ACCESS RIGHT  03/08/2018  . NEPHROLITHOTOMY Right 03/08/2018   Procedure: RIGHT NEPHROLITHOTOMY PERCUTANEOUS;  Surgeon: Marcine Matar, MD;  Location: [facility];  Service: Urology;  Laterality: Right;  . STONE EXTRACTION WITH BASKET Right 06/04/2015   Procedure: STONE EXTRACTION WITH BASKET;  Surgeon: Marcine Matar, MD;  Location: [facility];  Service: Urology;  Laterality: Right;  . TUBAL LIGATION  2010     OB History   No obstetric history on file.      Home Medications    Prior to Admission medications   Medication Sig Start Date End Date Taking? Authorizing Provider  cephALEXin (KEFLEX) 500 MG capsule Take 1 capsule (500 mg total) by mouth 2 (two) times daily. 03/09/18   Marcine Matar, MD  ibuprofen (ADVIL,MOTRIN) 600 MG tablet Take 1 tablet (600 mg total) by mouth every 6 (six) hours as needed. Patient taking differently: Take 600 mg by mouth every 6 (six) hours as needed for moderate pain.  10/29/17   Shaune Pollack, MD  oxyCODONE (OXY IR/ROXICODONE) 5 MG immediate release tablet Take 1  tablet (5 mg total) by mouth every 4 (four) hours as needed for moderate pain. 03/09/18   Marcine Matarahlstedt, Stephen, MD    Family History Family History  Problem Relation Age of Onset  . Hypertension Mother   . Arthritis Father   . Kawasaki disease Son     Social History Social History   Tobacco Use  . Smoking status: Never Smoker  . Smokeless tobacco: Never Used  Substance Use Topics  . Alcohol use: Not Currently  . Drug use: No     Allergies   Patient has no known allergies.    Review of Systems Review of Systems  All other systems reviewed and are negative.    Physical Exam Updated Vital Signs BP 113/89 (BP Location: Right Arm)   Pulse 84   Temp 98.2 F (36.8 C) (Oral)   Resp 16   Ht 5\' 6"  (1.676 m)   Wt 70.8 kg   LMP 11/23/2018   SpO2 100%   BMI 25.18 kg/m   Physical Exam Vitals signs and nursing note reviewed.  Constitutional:      Appearance: She is well-developed.  HENT:     Head: Normocephalic.  Neck:     Musculoskeletal: Normal range of motion.  Pulmonary:     Effort: Pulmonary effort is normal.  Abdominal:     General: There is no distension.  Musculoskeletal:     Comments: No warmth or swelling of the left shoulder as compared to the right.  Normal left radial pulse.  Normal grip strength left hand.  Full range of motion of left wrist and left elbow.  Able to passively move left shoulder although with some discomfort.  Pain exacerbated by active range of motion.  No rash.  No tenderness or deformity over the left clavicle.  Some tenderness at the [facility].  Neurological:     Mental Status: She is alert and oriented to person, place, and time.      ED Treatments / Results  Labs (all labs ordered are listed, but only abnormal results are displayed) Labs Reviewed - No data to display  EKG None  Radiology Dg Shoulder Left  Result Date: 12/10/2018 CLINICAL DATA:  LEFT shoulder pain, worsening last night, limited range of motion. EXAM: LEFT SHOULDER - 2+ VIEW COMPARISON:  None. FINDINGS: There is no evidence of fracture or dislocation. There is no evidence of arthropathy or other focal bone abnormality. Soft tissues are unremarkable. IMPRESSION: Negative. Electronically Signed   By: Bary RichardStan  Maynard M.D.   On: 12/10/2018 08:10    Procedures .Splint Application Performed by: Azalia Bilisampos, Minyon Billiter, MD Authorized by: Azalia Bilisampos, Luverne Zerkle, MD    SPLINT APPLICATION Authorized by: Azalia BilisKevin Shabre Kreher Consent: Verbal consent obtained. Risks and  benefits: risks, benefits and alternatives were discussed Consent given by: patient Splint applied by: nurse Location details: left UE Splint type: sling Supplies used: sling Post-procedure: The splinted body part was neurovascularly unchanged following the procedure. Patient tolerance: Patient tolerated the procedure well with no immediate complications.     Medications Ordered in ED Medications  oxyCODONE-acetaminophen (PERCOCET/ROXICET) 5-325 MG per tablet 1 tablet (1 tablet Oral Given 12/10/18 0807)  acetaminophen (TYLENOL) tablet 650 mg (650 mg Oral Given 12/10/18 0807)  cyclobenzaprine (FLEXERIL) tablet 5 mg (5 mg Oral Given 12/10/18 0807)     Initial Impression / Assessment and Plan / ED Course  I have reviewed the triage vital signs and the nursing notes.  Pertinent labs & imaging results that were available during my care  of the patient were reviewed by me and considered in my medical decision making (see chart for details).        Nonspecific left shoulder pain.  Likely musculoskeletal in nature.  Plain films pending at this time to evaluate for osseous abnormalities.  No trauma or known injury.  Likely plan home with orthopedic follow-up with anti-inflammatories and muscle relaxants and a sling for comfort.  Final Clinical Impressions(s) / ED Diagnoses   Final diagnoses:  Acute pain of left shoulder    ED Discharge Orders    None       Azalia Bilis, MD 12/10/18 [phone]

## 2018-12-10 NOTE — Discharge Instructions (Addendum)
Take ibuprofen and tylenol for the pain.  Apply heat and ice as needed Please call the orthopedist for for follow up Please call your primary care physician for follow up as well Return to the Emergency Department for new or worsening symptoms

## 2018-12-10 NOTE — ED Triage Notes (Signed)
Patient arrived POV.  Starting Saturday patient started having pain and "tension" in left shoulder at work around 8:00 pm.   Last night patient states shoulder became worse and unable to move shoulder for ROM. Patient states it feel "stiff".    Patient denies injuries or falls.   Patient states she is right handed.    A/Ox4 Ambulatory.

## 2020-06-19 ENCOUNTER — Emergency Department (HOSPITAL_COMMUNITY)
Admission: EM | Admit: 2020-06-19 | Discharge: 2020-06-20 | Disposition: A | Discharge status: Home / Self Care | Payer: BC Managed Care – PPO | Attending: Emergency Medicine | Admitting: Emergency Medicine

## 2020-06-19 ENCOUNTER — Other Ambulatory Visit: Payer: Self-pay

## 2020-06-19 ENCOUNTER — Encounter (HOSPITAL_COMMUNITY): Payer: Self-pay

## 2020-06-19 DIAGNOSIS — Z20822 Contact with and (suspected) exposure to covid-19: Secondary | ICD-10-CM | POA: Insufficient documentation

## 2020-06-19 DIAGNOSIS — J029 Acute pharyngitis, unspecified: Secondary | ICD-10-CM | POA: Diagnosis present

## 2020-06-19 DIAGNOSIS — J069 Acute upper respiratory infection, unspecified: Secondary | ICD-10-CM

## 2020-06-19 LAB — GROUP A STREP BY PCR: Group A Strep by PCR: NOT DETECTED

## 2020-06-19 NOTE — ED Triage Notes (Signed)
Pt reports sore throat, body aches, and chills x3 days.

## 2020-06-20 LAB — RESP PANEL BY RT-PCR (FLU A&B, COVID) ARPGX2
Influenza A by PCR: NEGATIVE
Influenza B by PCR: NEGATIVE
SARS Coronavirus 2 by RT PCR: NEGATIVE

## 2020-06-20 NOTE — ED Provider Notes (Signed)
[facility]-EMERGENCY DEPT Provider Note   CSN: [id number] Arrival date & time: 06/19/20  2006     History Chief Complaint  Patient presents with  . Sore Throat    Audrey Gonzalez is a 34 y.o. female.  Patient to ED with symptoms of sore throat, congestion, body aches, mild cough for the past 3 days. She denies known fever but reports chills. No known sick contacts. She is not COVID vaccinated. No nausea, vomiting or diarrhea. No rash.   The history is provided by the patient. No language interpreter was used.       Past Medical History:  Diagnosis Date  . Anemia    during pregnancy  . Arthritis    SEEING RHEUMATOLOGIST TO DETERMINE TREATMENT; PRESENT WITH VISIBLE SWELLING OF SMALL JOINTS IN HANDS   . Bilateral swelling of feet    REPORTS MAY BE DUE TO ARTHRITIS ; CURENTLY SEEING RHEUM TO DETERMINE TREATMENT   . History of kidney stones   . Kidney stones    had multiple stones during pregnancy, followed by [facility]  . Migraines    HAD ONE LAST NIGHT ; MOSTLY OCCURS IF SHE GOES LONG PERIODS WITHOUT EATIING     Patient Active Problem List   Diagnosis Date Noted  . Calculus of kidney 11/24/2017  . Ureteral stone 06/03/2015    Past Surgical History:  Procedure Laterality Date  . CYSTOSCOPY WITH RETROGRADE PYELOGRAM, URETEROSCOPY AND STENT PLACEMENT Right 06/04/2015   Procedure: CYSTOSCOPY WITH  RIGHT RETROGRADE PYELOGRAM,RIGHT URETEROSCOPY , LASER LITHOTRIPSY AND BASKET EXTRACTION OF STONE;  Surgeon: Marcine Matar, MD;  Location: [facility];  Service: Urology;  Laterality: Right;  . CYSTOSCOPY WITH RETROGRADE PYELOGRAM, URETEROSCOPY AND STENT PLACEMENT Right 11/24/2017   Procedure: CYSTOSCOPY WITH RIGHT  RETROGRADE RIGHT URETEROSCOPY, RIGHT STENT PLACEMENT;  Surgeon: Marcine Matar, MD;  Location: [facility];  Service: Urology;  Laterality: Right;  . HOLMIUM LASER APPLICATION Right 06/04/2015   Procedure: HOLMIUM LASER APPLICATION;  Surgeon:  Marcine Matar, MD;  Location: [facility];  Service: Urology;  Laterality: Right;  . IR URETERAL STENT PLACEMENT EXISTING ACCESS RIGHT  03/08/2018  . NEPHROLITHOTOMY Right 03/08/2018   Procedure: RIGHT NEPHROLITHOTOMY PERCUTANEOUS;  Surgeon: Marcine Matar, MD;  Location: [facility];  Service: Urology;  Laterality: Right;  . STONE EXTRACTION WITH BASKET Right 06/04/2015   Procedure: STONE EXTRACTION WITH BASKET;  Surgeon: Marcine Matar, MD;  Location: [facility];  Service: Urology;  Laterality: Right;  . TUBAL LIGATION  2010     OB History   No obstetric history on file.     Family History  Problem Relation Age of Onset  . Hypertension Mother   . Arthritis Father   . Kawasaki disease Son     Social History   Tobacco Use  . Smoking status: Never Smoker  . Smokeless tobacco: Never Used  Vaping Use  . Vaping Use: Never used  Substance Use Topics  . Alcohol use: Not Currently  . Drug use: No    Home Medications Prior to Admission medications   Medication Sig Start Date End Date Taking? Authorizing Provider  Multiple Vitamins-Minerals (MULTIVITAMIN ADULT) CHEW Chew 1 tablet by mouth daily.    [provider]  naproxen sodium (ALEVE) 220 MG tablet Take 220 mg by mouth 2 (two) times daily as needed (headache, pain).    [provider]    Allergies    Patient has no known allergies.  Review of Systems   Review of Systems  Constitutional: Positive for chills. Negative for fever.  HENT: Positive for congestion and sore throat.   Respiratory: Positive for cough. Negative for shortness of breath.   Cardiovascular: Negative.  Negative for chest pain.  Gastrointestinal: Negative.  Negative for diarrhea and nausea.  Genitourinary: Negative for dysuria.  Musculoskeletal: Positive for myalgias. Negative for neck stiffness.  Skin: Negative.  Negative for rash.  Neurological: Negative.     Physical Exam Updated Vital Signs BP (!) 122/96 (BP Location: Right Arm)    Pulse 79   Temp 98.5 F (36.9 C) (Oral)   Resp 17   Ht 5\' 6"  (1.676 m)   Wt 70.8 kg   SpO2 100%   BMI 25.18 kg/m   Physical Exam Vitals and nursing note reviewed.  Constitutional:      Appearance: She is well-developed.  HENT:     Head: Normocephalic.     Mouth/Throat:     Mouth: Mucous membranes are moist.     Pharynx: Uvula midline. No oropharyngeal exudate or posterior oropharyngeal erythema.  Cardiovascular:     Rate and Rhythm: Normal rate and regular rhythm.     Heart sounds: No murmur heard.   Pulmonary:     Effort: Pulmonary effort is normal.     Breath sounds: Normal breath sounds. No wheezing, rhonchi or rales.  Abdominal:     General: Bowel sounds are normal.     Palpations: Abdomen is soft.     Tenderness: There is no abdominal tenderness. There is no guarding or rebound.  Musculoskeletal:        General: Normal range of motion.     Cervical back: Normal range of motion and neck supple.  Skin:    General: Skin is warm and dry.     Findings: No rash.  Neurological:     Mental Status: She is alert and oriented to person, place, and time.     Cranial Nerves: No cranial nerve deficit.     ED Results / Procedures / Treatments   Labs (all labs ordered are listed, but only abnormal results are displayed) Labs Reviewed  GROUP A STREP BY PCR    EKG None  Radiology No results found.  Procedures Procedures (including critical care time)  Medications Ordered in ED Medications - No data to display  ED Course  I have reviewed the triage vital signs and the nursing notes.  Pertinent labs & imaging results that were available during my care of the patient were reviewed by me and considered in my medical decision making (see chart for details).    MDM Rules/Calculators/A&P                          Patient to ED with ss/sxs as per HPI.   Strep performed in ED is negative. Discussed obtaining COVID/Flu swab with the patient who is agreeable.   She  is nontoxic in appearance. No SOB, hypoxia. VSS. nO fever. She can be discharged home URI vs COVID vs influenza. Given instructions on obtaining result in MyChart.   Return precautions discussed.  Final Clinical Impression(s) / ED Diagnoses Final diagnoses:  None   1. Pharyngitis  Rx / DC Orders ED Discharge Orders    None       , PA-C 06/20/20 0241    14/04/21, MD 06/20/20 [phone]

## 2020-06-20 NOTE — Discharge Instructions (Signed)
Your COVID and influenza tests are pending and the result can be obtained in MyChart in about 2 hours. IF positive for COVID, you will need to isolate yourself as instructed to prevent spread of the virus.   Take Tylenol and/or ibuprofen for comfort and for any fever that may develop. If symptoms worsen and becoming concerning, return to the emergency department for further evaluation.

## 2020-07-10 ENCOUNTER — Emergency Department (HOSPITAL_COMMUNITY)
Admission: EM | Admit: 2020-07-10 | Discharge: 2020-07-10 | Disposition: A | Discharge status: Home / Self Care | Payer: BC Managed Care – PPO | Attending: Emergency Medicine | Admitting: Emergency Medicine

## 2020-07-10 ENCOUNTER — Other Ambulatory Visit: Payer: Self-pay

## 2020-07-10 DIAGNOSIS — U071 COVID-19: Secondary | ICD-10-CM | POA: Diagnosis not present

## 2020-07-10 DIAGNOSIS — R000 Tachycardia, unspecified: Secondary | ICD-10-CM | POA: Insufficient documentation

## 2020-07-10 DIAGNOSIS — R059 Cough, unspecified: Secondary | ICD-10-CM | POA: Diagnosis present

## 2020-07-10 LAB — RESP PANEL BY RT-PCR (FLU A&B, COVID) ARPGX2
Influenza A by PCR: NEGATIVE
Influenza B by PCR: NEGATIVE
SARS Coronavirus 2 by RT PCR: POSITIVE — AB

## 2020-07-10 MED ORDER — ONDANSETRON HCL 4 MG PO TABS: 4.0000 mg | ORAL_TABLET | Freq: Three times a day (TID) | ORAL | 0 refills | Status: AC | PRN

## 2020-07-10 MED ORDER — SODIUM CHLORIDE 0.9 % IV BOLUS
1000.0000 mL | Freq: Once | INTRAVENOUS | Status: AC
  Administered 2020-07-10: 1000 mL via INTRAVENOUS

## 2020-07-10 MED ORDER — ACETAMINOPHEN 325 MG PO TABS
650.0000 mg | ORAL_TABLET | Freq: Once | ORAL | Status: AC
  Administered 2020-07-10: 650 mg via ORAL
  Filled 2020-07-10: qty 2

## 2020-07-10 MED ORDER — ONDANSETRON HCL 4 MG/2ML IJ SOLN
4.0000 mg | Freq: Once | INTRAMUSCULAR | Status: AC
  Administered 2020-07-10: 4 mg via INTRAVENOUS
  Filled 2020-07-10: qty 2

## 2020-07-10 NOTE — ED Notes (Signed)
Date and time results received: 07/10/20 12:31 PM    Test: Covid Critical Value: (+)  Name of Provider Notified: Chrissie Noa PA  Orders Received? Or Actions Taken?:

## 2020-07-10 NOTE — ED Provider Notes (Signed)
Audrey Gonzalez Provider Note   CSN: [id number] Arrival date & time: 07/10/20  1035     History Chief Complaint  Patient presents with  . Sore Throat  . Nausea  . Emesis  . Chills    Audrey Gonzalez is a 34 y.o. female.  HPI   Patient with no significant medical history presents to the emergency department with chief complaint of URI-like symptoms.  Patient states symptoms started on Wednesday and have progressed and got worse.  She endorses fevers, chills, nasal congestion, sore throat, productive cough, nausea with some vomiting.  She endorses some general body aches.  She denies  recent sick contacts, is not vaccine against COVID-19, is not immunocompromise.  She denies any alleviating factors.  She states that she has been tolerating p.o. without difficulty.  Patient denies chest pain, shortness of breath, abdominal pain, urinary symptoms, pedal edema.  Past Medical History:  Diagnosis Date  . Anemia    during pregnancy  . Arthritis    SEEING RHEUMATOLOGIST TO DETERMINE TREATMENT; PRESENT WITH VISIBLE SWELLING OF SMALL JOINTS IN HANDS   . Bilateral swelling of feet    REPORTS MAY BE DUE TO ARTHRITIS ; CURENTLY SEEING RHEUM TO DETERMINE TREATMENT   . History of kidney stones   . Kidney stones    had multiple stones during pregnancy, followed by [facility]  . Migraines    HAD ONE LAST NIGHT ; MOSTLY OCCURS IF SHE GOES LONG PERIODS WITHOUT EATIING     Patient Active Problem List   Diagnosis Date Noted  . Calculus of kidney 11/24/2017  . Ureteral stone 06/03/2015    Past Surgical History:  Procedure Laterality Date  . CYSTOSCOPY WITH RETROGRADE PYELOGRAM, URETEROSCOPY AND STENT PLACEMENT Right 06/04/2015   Procedure: CYSTOSCOPY WITH  RIGHT RETROGRADE PYELOGRAM,RIGHT URETEROSCOPY , LASER LITHOTRIPSY AND BASKET EXTRACTION OF STONE;  Surgeon: Marcine Matar, MD;  Location: [facility];  Service: Urology;  Laterality: Right;  . CYSTOSCOPY  WITH RETROGRADE PYELOGRAM, URETEROSCOPY AND STENT PLACEMENT Right 11/24/2017   Procedure: CYSTOSCOPY WITH RIGHT  RETROGRADE RIGHT URETEROSCOPY, RIGHT STENT PLACEMENT;  Surgeon: Marcine Matar, MD;  Location: [facility];  Service: Urology;  Laterality: Right;  . HOLMIUM LASER APPLICATION Right 06/04/2015   Procedure: HOLMIUM LASER APPLICATION;  Surgeon: Marcine Matar, MD;  Location: [facility];  Service: Urology;  Laterality: Right;  . IR URETERAL STENT PLACEMENT EXISTING ACCESS RIGHT  03/08/2018  . NEPHROLITHOTOMY Right 03/08/2018   Procedure: RIGHT NEPHROLITHOTOMY PERCUTANEOUS;  Surgeon: Marcine Matar, MD;  Location: [facility];  Service: Urology;  Laterality: Right;  . STONE EXTRACTION WITH BASKET Right 06/04/2015   Procedure: STONE EXTRACTION WITH BASKET;  Surgeon: Marcine Matar, MD;  Location: [facility];  Service: Urology;  Laterality: Right;  . TUBAL LIGATION  2010     OB History   No obstetric history on file.     Family History  Problem Relation Age of Onset  . Hypertension Mother   . Arthritis Father   . Kawasaki disease Son     Social History   Tobacco Use  . Smoking status: Never Smoker  . Smokeless tobacco: Never Used  Vaping Use  . Vaping Use: Never used  Substance Use Topics  . Alcohol use: Not Currently  . Drug use: No    Home Medications Prior to Admission medications   Medication Sig Start Date End Date Taking? Authorizing Provider  Multiple Vitamins-Minerals (MULTIVITAMIN ADULT) CHEW Chew 1 tablet by mouth daily.    [provider]  naproxen sodium (ALEVE) 220 MG tablet Take 220 mg by mouth 2 (two) times daily as needed (headache, pain).    [provider]  ondansetron (ZOFRAN) 4 MG tablet Take 1 tablet (4 mg total) by mouth every 8 (eight) hours as needed for nausea or vomiting. 07/10/20   Carroll Sage, PA-C    Allergies    Patient has no known allergies.  Review of Systems   Review of Systems  Constitutional: Positive for  chills and fever.  HENT: Positive for congestion and sore throat.   Respiratory: Positive for cough. Negative for shortness of breath.   Cardiovascular: Negative for chest pain.  Gastrointestinal: Positive for nausea and vomiting. Negative for abdominal pain.  Genitourinary: Negative for enuresis.  Musculoskeletal: Negative for back pain.  Skin: Negative for rash.  Neurological: Negative for dizziness.  Hematological: Does not bruise/bleed easily.    Physical Exam Updated Vital Signs BP 113/76   Pulse (!) 106   Temp 100.3 F (37.9 C) (Oral)   Resp 18   Ht 5\' 6"  (1.676 m)   Wt 70.8 kg   LMP 07/01/2020   SpO2 99%   BMI 25.18 kg/m   Physical Exam Vitals and nursing note reviewed.  Constitutional:      General: She is not in acute distress.    Appearance: She is not ill-appearing.  HENT:     Head: Normocephalic and atraumatic.     Right Ear: Tympanic membrane, ear canal and external ear normal.     Left Ear: Tympanic membrane, ear canal and external ear normal.     Nose: No congestion.     Comments: Patient has bilateral erythematous turbinates, nasal congestion present    Mouth/Throat:     Mouth: Mucous membranes are moist.     Pharynx: Oropharynx is clear. No oropharyngeal exudate or posterior oropharyngeal erythema.  Eyes:     Conjunctiva/sclera: Conjunctivae normal.  Cardiovascular:     Rate and Rhythm: Regular rhythm. Tachycardia present.     Pulses: Normal pulses.     Heart sounds: No murmur heard. No friction rub. No gallop.   Pulmonary:     Effort: No respiratory distress.     Breath sounds: No wheezing, rhonchi or rales.  Abdominal:     Palpations: Abdomen is soft.     Tenderness: There is no abdominal tenderness.  Musculoskeletal:     Right lower leg: No edema.     Left lower leg: No edema.     Comments: Patient is moving all 4 extremities out difficulty.  Skin:    General: Skin is warm and dry.  Neurological:     Mental Status: She is alert.   Psychiatric:        Mood and Affect: Mood normal.     ED Results / Procedures / Treatments   Labs (all labs ordered are listed, but only abnormal results are displayed) Labs Reviewed  RESP PANEL BY RT-PCR (FLU A&B, COVID) ARPGX2 - Abnormal; Notable for the following components:      Result Value   SARS Coronavirus 2 by RT PCR POSITIVE (*)    All other components within normal limits    EKG None  Radiology No results found.  Procedures Procedures (including critical care time)  Medications Ordered in ED Medications  acetaminophen (TYLENOL) tablet 650 mg (650 mg Oral Given 07/10/20 1338)  sodium chloride 0.9 % bolus 1,000 mL (1,000 mLs Intravenous New Bag/Given 07/10/20 1345)  ondansetron (ZOFRAN) injection 4 mg (  4 mg Intravenous Given 07/10/20 1338)    ED Course  I have reviewed the triage vital signs and the nursing notes.  Pertinent labs & imaging results that were available during my care of the patient were reviewed by me and considered in my medical decision making (see chart for details).    MDM Rules/Calculators/A&P                          Patient presents with URI-like symptoms.  She is alert, does not appear in acute distress, vital signs segment for tachycardia.  Will obtain respiratory panel, provide patient with fluids, Tylenol and reevaluate.  Patient is reevaluated after providing with fluids, antiemetics, and Tylenol, she states she is feeling much better.  Vital signs have improved.  Low suspicion for systemic infection as patient is nontoxic-appearing, vital signs reassuring, no obvious source infection noted on exam.  Low suspicion for pneumonia as lung sounds are clear bilaterally.   I have low suspicion for PE as patient denies pleuritic chest pain, shortness of breath, no pedal edema noted on exam, patient is low risk factors, history is more consistent with viral URI. low suspicion for strep throat as oropharynx was visualized, no erythema or  exudates noted.  Low suspicion patient would need  hospitalized due to viral infection or Covid as vital signs reassuring, patient is not in respiratory distress.  I suspect patient's tachycardia is secondary to her fever from COVID-19.  I recommend over-the-counter pain medications, provide her with antiemetics and referred her to the infusion clinic.  Vital signs have remained stable, no indication for hospital admission.  Patient given at home care as well strict return precautions.  Patient verbalized that they understood agreed to said plan.   Final Clinical Impression(s) / ED Diagnoses Final diagnoses:  COVID    Rx / DC Orders ED Discharge Orders         Ordered    ondansetron (ZOFRAN) 4 MG tablet  Every 8 hours PRN        07/10/20 1444           Barnie Del 07/10/20 1456    Tegeler, Canary Brim, MD 07/10/20 1529

## 2020-07-10 NOTE — Discharge Instructions (Addendum)
You have been seen here for URI like symptoms.  I have given you a prescription for Zofran this will help with your nausea, please use as needed. I recommend taking Tylenol for fever control and ibuprofen for pain control please follow dosing on the back of bottle.  I recommend staying hydrated and if you do not an appetite, I recommend soups as this will provide you with fluids and calories.  you are Covid positive you must self quarantine for 10 days starting on symptom onset.  I would like you to contact "post Covid care" as they will provide you with information how to manage your Covid symptoms.   Come back to the emergency department if you develop chest pain, shortness of breath, severe abdominal pain, uncontrolled nausea, vomiting, diarrhea.

## 2020-07-10 NOTE — ED Triage Notes (Signed)
Patient reports sore throat starting Wednesday, developed chills, cough and body aches yesterday. Denies flu and covid vaccines. Says throat isnt sore anymore, but feels week

## 2020-07-12 ENCOUNTER — Encounter: Payer: Self-pay | Admitting: Oncology

## 2020-07-12 ENCOUNTER — Telehealth: Payer: Self-pay | Admitting: Oncology

## 2020-07-12 NOTE — Telephone Encounter (Signed)
Called to discuss with Audrey Gonzalez about Covid symptoms and the use of a monoclonal antibody infusion for those with mild to moderate Covid symptoms and at a high risk of hospitalization.     Pt is un-vaccinated. She is otherwise healthy. SVI 2. Therefore,due to drug shortage, qualifying criteria were narrowed and patient does not qualify for mab infusion based on narrowed criteria. Symptoms tier reviewed as well as criteria for ending isolation. Preventative practices reviewed. Patient verbalized understanding. Reviewed criteria for booster.    Past Medical History:  Diagnosis Date  . Anemia    during pregnancy  . Arthritis    SEEING RHEUMATOLOGIST TO DETERMINE TREATMENT; PRESENT WITH VISIBLE SWELLING OF SMALL JOINTS IN HANDS   . Bilateral swelling of feet    REPORTS MAY BE DUE TO ARTHRITIS ; CURENTLY SEEING RHEUM TO DETERMINE TREATMENT   . History of kidney stones   . Kidney stones    had multiple stones during pregnancy, followed by [facility]  . Migraines    HAD ONE LAST NIGHT ; MOSTLY OCCURS IF SHE GOES LONG PERIODS WITHOUT EATIING    She does not appear to qualify for MAB. Symptom onset 07/08/20. SVI 1/2.    Mignon Pine, NP [phone] [email or web address]

## 2021-02-08 ENCOUNTER — Encounter (HOSPITAL_COMMUNITY): Payer: Self-pay

## 2021-02-08 ENCOUNTER — Other Ambulatory Visit: Payer: Self-pay

## 2021-02-08 ENCOUNTER — Emergency Department (HOSPITAL_COMMUNITY)
Admission: EM | Admit: 2021-02-08 | Discharge: 2021-02-09 | Disposition: A | Discharge status: Home / Self Care | Payer: BC Managed Care – PPO | Attending: Emergency Medicine | Admitting: Emergency Medicine

## 2021-02-08 DIAGNOSIS — Z8616 Personal history of COVID-19: Secondary | ICD-10-CM | POA: Insufficient documentation

## 2021-02-08 DIAGNOSIS — U071 COVID-19: Secondary | ICD-10-CM | POA: Insufficient documentation

## 2021-02-08 DIAGNOSIS — Z28310 Unvaccinated for covid-19: Secondary | ICD-10-CM | POA: Diagnosis not present

## 2021-02-08 DIAGNOSIS — R519 Headache, unspecified: Secondary | ICD-10-CM | POA: Diagnosis present

## 2021-02-08 NOTE — ED Triage Notes (Signed)
Pt c/o generalized body aches, chills, headache for last few days, worsened today. Pt has some swelling to her nose, said this is not new, she is seeing a doctor for it.

## 2021-02-09 LAB — RESP PANEL BY RT-PCR (FLU A&B, COVID) ARPGX2
Influenza A by PCR: NEGATIVE
Influenza B by PCR: NEGATIVE
SARS Coronavirus 2 by RT PCR: POSITIVE — AB

## 2021-02-09 MED ORDER — NIRMATRELVIR/RITONAVIR (PAXLOVID)TABLET: 3.0000 | ORAL_TABLET | Freq: Two times a day (BID) | ORAL | 0 refills | Status: AC

## 2021-02-09 NOTE — ED Provider Notes (Signed)
[facility]-EMERGENCY DEPT Provider Note   CSN: [id number] Arrival date & time: 02/08/21  1511     History Chief Complaint  Patient presents with   Generalized Body Aches   Chills   Headache    Audrey Gonzalez is a 35 y.o. female.  The history is provided by the patient.  Headache Associated symptoms: cough and myalgias   Associated symptoms: no abdominal pain   Patient presents with headache myalgias and feeling bad.  Has had since Sunday.  Slight sore throat.  Had a extended wait before being seen by me.  Did have COVID around 8 months ago.  Unvaccinated.  No nausea or vomiting.    Past Medical History:  Diagnosis Date   Anemia    during pregnancy   Arthritis    SEEING RHEUMATOLOGIST TO DETERMINE TREATMENT; PRESENT WITH VISIBLE SWELLING OF SMALL JOINTS IN HANDS    Bilateral swelling of feet    REPORTS MAY BE DUE TO ARTHRITIS ; CURENTLY SEEING RHEUM TO DETERMINE TREATMENT    History of kidney stones    Kidney stones    had multiple stones during pregnancy, followed by [facility]   Migraines    HAD ONE LAST NIGHT ; MOSTLY OCCURS IF SHE GOES LONG PERIODS WITHOUT EATIING     Patient Active Problem List   Diagnosis Date Noted   Calculus of kidney 11/24/2017   Ureteral stone 06/03/2015    Past Surgical History:  Procedure Laterality Date   CYSTOSCOPY WITH RETROGRADE PYELOGRAM, URETEROSCOPY AND STENT PLACEMENT Right 06/04/2015   Procedure: CYSTOSCOPY WITH  RIGHT RETROGRADE PYELOGRAM,RIGHT URETEROSCOPY , LASER LITHOTRIPSY AND BASKET EXTRACTION OF STONE;  Surgeon: Marcine Matar, MD;  Location: [facility];  Service: Urology;  Laterality: Right;   CYSTOSCOPY WITH RETROGRADE PYELOGRAM, URETEROSCOPY AND STENT PLACEMENT Right 11/24/2017   Procedure: CYSTOSCOPY WITH RIGHT  RETROGRADE RIGHT URETEROSCOPY, RIGHT STENT PLACEMENT;  Surgeon: Marcine Matar, MD;  Location: [facility];  Service: Urology;  Laterality: Right;   HOLMIUM LASER APPLICATION Right  06/04/2015   Procedure: HOLMIUM LASER APPLICATION;  Surgeon: Marcine Matar, MD;  Location: [facility];  Service: Urology;  Laterality: Right;   IR URETERAL STENT PLACEMENT EXISTING ACCESS RIGHT  03/08/2018   NEPHROLITHOTOMY Right 03/08/2018   Procedure: RIGHT NEPHROLITHOTOMY PERCUTANEOUS;  Surgeon: Marcine Matar, MD;  Location: [facility];  Service: Urology;  Laterality: Right;   STONE EXTRACTION WITH BASKET Right 06/04/2015   Procedure: STONE EXTRACTION WITH BASKET;  Surgeon: Marcine Matar, MD;  Location: [facility];  Service: Urology;  Laterality: Right;   TUBAL LIGATION  2010     OB History   No obstetric history on file.     Family History  Problem Relation Age of Onset   Hypertension Mother    Arthritis Father    Kawasaki disease Son     Social History   Tobacco Use   Smoking status: Never   Smokeless tobacco: Never  Vaping Use   Vaping Use: Never used  Substance Use Topics   Alcohol use: Not Currently   Drug use: No    Home Medications Prior to Admission medications   Medication Sig Start Date End Date Taking? Authorizing Provider  nirmatrelvir/ritonavir EUA (PAXLOVID) TABS Take 3 tablets by mouth 2 (two) times daily for 5 days. Patient GFR is 60. Take nirmatrelvir (150 mg) two tablets twice daily for 5 days and ritonavir (100 mg) one tablet twice daily for 5 days. 02/09/21 02/14/21 Yes Benjiman Core, MD  Multiple Vitamins-Minerals (MULTIVITAMIN ADULT)  CHEW Chew 1 tablet by mouth daily.    [provider]  naproxen sodium (ALEVE) 220 MG tablet Take 220 mg by mouth 2 (two) times daily as needed (headache, pain).    [provider]  ondansetron (ZOFRAN) 4 MG tablet Take 1 tablet (4 mg total) by mouth every 8 (eight) hours as needed for nausea or vomiting. 07/10/20   Carroll Sage, PA-C    Allergies    Patient has no known allergies.  Review of Systems   Review of Systems  Constitutional:  Positive for appetite change.  Respiratory:   Positive for cough.   Cardiovascular:  Negative for leg swelling.  Gastrointestinal:  Negative for abdominal pain.  Genitourinary:  Negative for flank pain.  Musculoskeletal:  Positive for myalgias.  Skin:  Negative for rash.  Neurological:  Positive for headaches.  Psychiatric/Behavioral:  Negative for confusion.    Physical Exam Updated Vital Signs BP 111/80 (BP Location: Left Arm)   Pulse 85   Temp 99.4 F (37.4 C)   Resp 16   SpO2 100%   Physical Exam Vitals and nursing note reviewed.  Constitutional:      Appearance: She is well-developed.  HENT:     Head: Atraumatic.  Eyes:     Pupils: Pupils are equal, round, and reactive to light.  Cardiovascular:     Rate and Rhythm: Regular rhythm.  Abdominal:     Tenderness: There is no abdominal tenderness.  Musculoskeletal:     Cervical back: Neck supple.  Skin:    General: Skin is warm.     Capillary Refill: Capillary refill takes less than 2 seconds.  Neurological:     Mental Status: She is alert.  Psychiatric:        Mood and Affect: Mood normal.    ED Results / Procedures / Treatments   Labs (all labs ordered are listed, but only abnormal results are displayed) Labs Reviewed  RESP PANEL BY RT-PCR (FLU A&B, COVID) ARPGX2 - Abnormal; Notable for the following components:      Result Value   SARS Coronavirus 2 by RT PCR POSITIVE (*)    All other components within normal limits    EKG None  Radiology No results found.  Procedures Procedures   Medications Ordered in ED Medications - No data to display  ED Course  I have reviewed the triage vital signs and the nursing notes.  Pertinent labs & imaging results that were available during my care of the patient were reviewed by me and considered in my medical decision making (see chart for details).    MDM Rules/Calculators/A&P                           Patient with viral type syndromes.  Had positive COVID test here.  Symptoms began on Sunday.   Quarantine through Friday.  Than 5 days of masking.  Work note given.  Is high risk because she is unvaccinated.  Prescription for Paxlovid given. Final Clinical Impression(s) / ED Diagnoses Final diagnoses:  COVID-19    Rx / DC Orders ED Discharge Orders          Ordered    nirmatrelvir/ritonavir EUA (PAXLOVID) TABS  2 times daily        07 /26/22 0740             09-10-1990, MD 02/09/21 [phone]

## 2021-02-09 NOTE — ED Notes (Signed)
Lab reports critical Covid swab (Positive)

## 2021-05-15 ENCOUNTER — Emergency Department (HOSPITAL_COMMUNITY)
Admission: EM | Admit: 2021-05-15 | Discharge: 2021-05-15 | Disposition: A | Discharge status: Home / Self Care | Payer: BC Managed Care – PPO | Attending: Emergency Medicine | Admitting: Emergency Medicine

## 2021-05-15 ENCOUNTER — Encounter (HOSPITAL_COMMUNITY): Payer: Self-pay

## 2021-05-15 ENCOUNTER — Other Ambulatory Visit: Payer: Self-pay

## 2021-05-15 DIAGNOSIS — K047 Periapical abscess without sinus: Secondary | ICD-10-CM | POA: Diagnosis not present

## 2021-05-15 DIAGNOSIS — K0889 Other specified disorders of teeth and supporting structures: Secondary | ICD-10-CM | POA: Diagnosis present

## 2021-05-15 MED ORDER — ONDANSETRON 4 MG PO TBDP
4.0000 mg | ORAL_TABLET | Freq: Once | ORAL | Status: AC
  Administered 2021-05-15: 4 mg via ORAL
  Filled 2021-05-15: qty 1

## 2021-05-15 MED ORDER — BUPIVACAINE-EPINEPHRINE (PF) 0.5% -1:200000 IJ SOLN
1.8000 mL | Freq: Once | INTRAMUSCULAR | Status: AC
  Administered 2021-05-15: 1.8 mL
  Filled 2021-05-15: qty 1.8

## 2021-05-15 MED ORDER — AMOXICILLIN-POT CLAVULANATE 875-125 MG PO TABS
1.0000 | ORAL_TABLET | Freq: Once | ORAL | Status: AC
  Administered 2021-05-15: 1 via ORAL
  Filled 2021-05-15: qty 1

## 2021-05-15 MED ORDER — HYDROCODONE-ACETAMINOPHEN 5-325 MG PO TABS: 1.0000 | ORAL_TABLET | Freq: Four times a day (QID) | ORAL | 0 refills | Status: AC | PRN

## 2021-05-15 MED ORDER — AMOXICILLIN-POT CLAVULANATE 875-125 MG PO TABS: 1.0000 | ORAL_TABLET | Freq: Two times a day (BID) | ORAL | 0 refills | Status: DC

## 2021-05-15 MED ORDER — IBUPROFEN 600 MG PO TABS: 600.0000 mg | ORAL_TABLET | Freq: Four times a day (QID) | ORAL | 0 refills | Status: AC | PRN

## 2021-05-15 MED ORDER — HYDROCODONE-ACETAMINOPHEN 5-325 MG PO TABS
1.0000 | ORAL_TABLET | Freq: Once | ORAL | Status: AC
  Administered 2021-05-15: 1 via ORAL
  Filled 2021-05-15: qty 1

## 2021-05-15 NOTE — ED Triage Notes (Signed)
Pt comes of abscess and swelling to right lower jaw x 2 days.

## 2021-05-15 NOTE — ED Provider Notes (Signed)
[facility]-EMERGENCY DEPT Provider Note   CSN: [id number] Arrival date & time: 05/15/21  0057     History Chief Complaint  Patient presents with   Abscess    Audrey Gonzalez is a 35 y.o. female.  The history is provided by the patient.  Abscess Audrey L Hummel is a 35 y.o. female who presents to the Emergency Department complaining of dental pain. She presents to the emergency department for evaluation of dental pain that started on Tuesday. On Thursday she started to note swelling in that area. Pain is severe in nature. She has no associated fevers, difficulty breathing or difficulty swallowing. No prior similar symptoms. She takes no medications. She does not currently have a Education officer, community.    Past Medical History:  Diagnosis Date   Anemia    during pregnancy   Arthritis    SEEING RHEUMATOLOGIST TO DETERMINE TREATMENT; PRESENT WITH VISIBLE SWELLING OF SMALL JOINTS IN HANDS    Bilateral swelling of feet    REPORTS MAY BE DUE TO ARTHRITIS ; CURENTLY SEEING RHEUM TO DETERMINE TREATMENT    History of kidney stones    Kidney stones    had multiple stones during pregnancy, followed by [facility]   Migraines    HAD ONE LAST NIGHT ; MOSTLY OCCURS IF SHE GOES LONG PERIODS WITHOUT EATIING     Patient Active Problem List   Diagnosis Date Noted   Calculus of kidney 11/24/2017   Ureteral stone 06/03/2015    Past Surgical History:  Procedure Laterality Date   CYSTOSCOPY WITH RETROGRADE PYELOGRAM, URETEROSCOPY AND STENT PLACEMENT Right 06/04/2015   Procedure: CYSTOSCOPY WITH  RIGHT RETROGRADE PYELOGRAM,RIGHT URETEROSCOPY , LASER LITHOTRIPSY AND BASKET EXTRACTION OF STONE;  Surgeon: Marcine Matar, MD;  Location: [facility];  Service: Urology;  Laterality: Right;   CYSTOSCOPY WITH RETROGRADE PYELOGRAM, URETEROSCOPY AND STENT PLACEMENT Right 11/24/2017   Procedure: CYSTOSCOPY WITH RIGHT  RETROGRADE RIGHT URETEROSCOPY, RIGHT STENT PLACEMENT;  Surgeon: Marcine Matar, MD;  Location: [facility];  Service: Urology;  Laterality: Right;   HOLMIUM LASER APPLICATION Right 06/04/2015   Procedure: HOLMIUM LASER APPLICATION;  Surgeon: Marcine Matar, MD;  Location: [facility];  Service: Urology;  Laterality: Right;   IR URETERAL STENT PLACEMENT EXISTING ACCESS RIGHT  03/08/2018   NEPHROLITHOTOMY Right 03/08/2018   Procedure: RIGHT NEPHROLITHOTOMY PERCUTANEOUS;  Surgeon: Marcine Matar, MD;  Location: [facility];  Service: Urology;  Laterality: Right;   STONE EXTRACTION WITH BASKET Right 06/04/2015   Procedure: STONE EXTRACTION WITH BASKET;  Surgeon: Marcine Matar, MD;  Location: [facility];  Service: Urology;  Laterality: Right;   TUBAL LIGATION  2010     OB History   No obstetric history on file.     Family History  Problem Relation Age of Onset   Hypertension Mother    Arthritis Father    Kawasaki disease Son     Social History   Tobacco Use   Smoking status: Never   Smokeless tobacco: Never  Vaping Use   Vaping Use: Never used  Substance Use Topics   Alcohol use: Not Currently   Drug use: No    Home Medications Prior to Admission medications   Medication Sig Start Date End Date Taking? Authorizing Provider  amoxicillin-clavulanate (AUGMENTIN) 875-125 MG tablet Take 1 tablet by mouth every 12 (twelve) hours. 05/15/21  Yes Tilden Fossa, MD  HYDROcodone-acetaminophen (NORCO/VICODIN) 5-325 MG tablet Take 1 tablet by mouth every 6 (six) hours as needed. 05/15/21  Yes Tilden Fossa, MD  ibuprofen (ADVIL) 600 MG tablet Take 1 tablet (600 mg total) by mouth every 6 (six) hours as needed. 05/15/21  Yes Tilden Fossa, MD  Multiple Vitamins-Minerals (MULTIVITAMIN ADULT) CHEW Chew 1 tablet by mouth daily.    [provider]  ondansetron (ZOFRAN) 4 MG tablet Take 1 tablet (4 mg total) by mouth every 8 (eight) hours as needed for nausea or vomiting. 07/10/20   Carroll Sage, PA-C    Allergies    Patient has no known  allergies.  Review of Systems   Review of Systems  All other systems reviewed and are negative.  Physical Exam Updated Vital Signs BP 105/77 (BP Location: Right Arm)   Pulse 83   Temp 98.4 F (36.9 C) (Oral)   Resp 16   Ht 5\' 5"  (1.651 m)   Wt 74.8 kg   SpO2 100%   BMI 27.46 kg/m   Physical Exam Vitals and nursing note reviewed.  Constitutional:      Appearance: She is well-developed.  HENT:     Head: Normocephalic and atraumatic.     Comments: Moderate soft tissue swelling to the right mandible. There is swelling and soft tissue tenderness over the buccal mucosa. Tooth30 is absent. At this area there is significant soft tissue swelling and fluctuations. Cardiovascular:     Rate and Rhythm: Normal rate and regular rhythm.     Heart sounds: No murmur heard. Pulmonary:     Effort: Pulmonary effort is normal. No respiratory distress.     Breath sounds: Normal breath sounds.  Musculoskeletal:        General: No tenderness.     Cervical back: Neck supple.  Skin:    General: Skin is warm and dry.  Neurological:     Mental Status: She is alert and oriented to person, place, and time.  Psychiatric:        Behavior: Behavior normal.    ED Results / Procedures / Treatments   Labs (all labs ordered are listed, but only abnormal results are displayed) Labs Reviewed - No data to display  EKG None  Radiology No results found.  Procedures Dental Block  Date/Time: 05/15/2021 5:30 AM Performed by: 05/17/2021, MD Authorized by: Tilden Fossa, MD   Consent:    Consent obtained:  Verbal   Consent given by:  Patient Universal protocol:    Patient identity confirmed:  Verbally with patient Indications:    Indications: dental abscess and dental pain   Location:    Block type:  Supraperiosteal   Supraperiosteal location:  Lower teeth Procedure details:    Topical anesthetic:  Benzocaine gel   Syringe type:  Controlled syringe   Needle gauge:  25 G    Anesthetic injected:  Bupivacaine 0.25% w/o epi   Injection procedure:  Anatomic landmarks identified, introduced needle and incremental injection Post-procedure details:    Outcome:  Pain improved   Procedure completion:  Tolerated .Tilden FossaIncision and Drainage  Date/Time: 05/15/2021 5:32 AM Performed by: 05/17/2021, MD Authorized by: Tilden Fossa, MD   Consent:    Consent obtained:  Verbal Universal protocol:    Patient identity confirmed:  Verbally with patient Location:    Indications for incision and drainage: Dental abscess.   Location:  Mouth   Mouth location:  Alveolar process Procedure type:    Complexity:  Simple Procedure details:    Incision types:  Stab incision   Wound management:  Probed and deloculated   Drainage:  Purulent   Drainage amount:  Moderate   Wound treatment:  Wound left open Post-procedure details:    Procedure completion:  Tolerated   Medications Ordered in ED Medications  amoxicillin-clavulanate (AUGMENTIN) 875-125 MG per tablet 1 tablet (has no administration in time range)  bupivacaine-epinephrine (MARCAINE W/ EPI) 0.5% -1:200000 injection 1.8 mL (1.8 mLs Infiltration Given by Other 05/15/21 0448)  ondansetron (ZOFRAN-ODT) disintegrating tablet 4 mg (4 mg Oral Given 05/15/21 0504)  HYDROcodone-acetaminophen (NORCO/VICODIN) 5-325 MG per tablet 1 tablet (1 tablet Oral Given 05/15/21 7034)    ED Course  I have reviewed the triage vital signs and the nursing notes.  Pertinent labs & imaging results that were available during my care of the patient were reviewed by me and considered in my medical decision making (see chart for details).    MDM Rules/Calculators/A&P                          patient here for evaluation of dental pain and facial swelling. On examination she has dental abscess. The area was anesthetized and drained per note. She had partial improvement in her symptoms after the procedure was performed. No evidence of Ludwig's  or serious bacterial infection. Plan to discharge home with dentistry follow-up and return precautions.  Final Clinical Impression(s) / ED Diagnoses Final diagnoses:  Dental abscess    Rx / DC Orders ED Discharge Orders          Ordered    amoxicillin-clavulanate (AUGMENTIN) 875-125 MG tablet  Every 12 hours        05/15/21 0145    ibuprofen (ADVIL) 600 MG tablet  Every 6 hours PRN        05/15/21 0521    HYDROcodone-acetaminophen (NORCO/VICODIN) 5-325 MG tablet  Every 6 hours PRN        05/15/21 0521             Tilden Fossa, MD 05/15/21 [phone]

## 2021-05-15 NOTE — ED Notes (Signed)
Pt able to keep down juice with no nausea/vomiting.

## 2021-05-15 NOTE — ED Provider Notes (Signed)
Emergency Medicine Provider Triage Evaluation Note  [place] L Sunday , a 35 y.o. female  was evaluated in triage.  Pt complains of dental pain. She has been experiencing dental pain since Tuesday on the right lower gumline and swelling since Thursday. No fevers. She can breathe and swallow without difficulty.  Review of Systems  Positive: dental pain and swelling Negative: fevers, difficulty breathing  Physical Exam  BP 112/85 (BP Location: Left Arm)   Pulse 91   Temp 98.4 F (36.9 C) (Oral)   Resp 17   Ht 5\' 5"  (1.651 m)   Wt 74.8 kg   SpO2 100%   BMI 27.46 kg/m  Gen:   Awake, no distress   Resp:  Normal effort  MSK:   Moves extremities without difficulty  Other:  there is swelling and tenderness over the right lower jaw .  Medical Decision Making  Medically screening exam initiated at 1:44 AM.  Appropriate orders placed.  L Haisley was informed that the remainder of the evaluation will be completed by another provider, this initial triage assessment does not replace that evaluation, and the importance of remaining in the ED until their evaluation is complete.     Steele Berg, MD 05/15/21 [phone]

## 2022-04-28 ENCOUNTER — Encounter (HOSPITAL_COMMUNITY): Payer: Self-pay

## 2022-04-28 ENCOUNTER — Emergency Department (HOSPITAL_COMMUNITY)
Admission: EM | Admit: 2022-04-28 | Discharge: 2022-04-28 | Disposition: A | Discharge status: Home / Self Care | Payer: BC Managed Care – PPO | Attending: Emergency Medicine | Admitting: Emergency Medicine

## 2022-04-28 ENCOUNTER — Other Ambulatory Visit: Payer: Self-pay

## 2022-04-28 DIAGNOSIS — H6092 Unspecified otitis externa, left ear: Secondary | ICD-10-CM | POA: Diagnosis not present

## 2022-04-28 DIAGNOSIS — H60502 Unspecified acute noninfective otitis externa, left ear: Secondary | ICD-10-CM

## 2022-04-28 DIAGNOSIS — H9202 Otalgia, left ear: Secondary | ICD-10-CM | POA: Diagnosis present

## 2022-04-28 MED ORDER — NEOMYCIN-POLYMYXIN-HC 3.5-10000-1 OT SUSP: 4.0000 [drp] | Freq: Four times a day (QID) | OTIC | 0 refills | Status: AC

## 2022-04-28 MED ORDER — ACETAMINOPHEN 325 MG PO TABS
650.0000 mg | ORAL_TABLET | Freq: Once | ORAL | Status: AC
  Administered 2022-04-28: 650 mg via ORAL
  Filled 2022-04-28: qty 2

## 2022-04-28 MED ORDER — IBUPROFEN 200 MG PO TABS
600.0000 mg | ORAL_TABLET | Freq: Once | ORAL | Status: AC
  Administered 2022-04-28: 600 mg via ORAL
  Filled 2022-04-28: qty 3

## 2022-04-28 MED ORDER — IBUPROFEN 200 MG PO TABS
ORAL_TABLET | ORAL | Status: AC
  Administered 2022-04-28: 200 mg
  Filled 2022-04-28: qty 1

## 2022-04-28 NOTE — ED Triage Notes (Signed)
Patient c/o left ear pain since yesterday. 

## 2022-04-28 NOTE — ED Provider Notes (Signed)
[facility] DEPT Provider Note   CSN: [id number] Arrival date & time: 04/28/22  7121     History  Chief Complaint  Patient presents with   Otalgia    N[place] L Lezama is a 36 y.o. female.  Patient is a 36 year old female with a past medical history of migraines presenting to the emergency department with ear pain.  Patient states that she has been having pain of her left ear over the last 2 days and was getting to the point where she was having trouble sleeping last night.  She denies any drainage from her ear.  She denies any mouth pain or drainage in her mouth.  She states that the pain is not radiating.  She denies any foreign bodies in her ear or being underwater prior to the symptoms starting.  She denies any known sick contacts.  She denies any fevers or chills.  The history is provided by the patient.  Otalgia      Home Medications Prior to Admission medications   Medication Sig Start Date End Date Taking? Authorizing Provider  neomycin-polymyxin-hydrocortisone (CORTISPORIN) 3.5-10000-1 OTIC suspension Place 4 drops into the left ear 4 (four) times daily for 10 days. 04/28/22 05/08/22 Yes Leanord Asal K, DO  amoxicillin-clavulanate (AUGMENTIN) 875-125 MG tablet Take 1 tablet by mouth every 12 (twelve) hours. 05/15/21   Quintella Reichert, MD  HYDROcodone-acetaminophen (NORCO/VICODIN) 5-325 MG tablet Take 1 tablet by mouth every 6 (six) hours as needed. 05/15/21   Quintella Reichert, MD  ibuprofen (ADVIL) 600 MG tablet Take 1 tablet (600 mg total) by mouth every 6 (six) hours as needed. 05/15/21   Quintella Reichert, MD  Multiple Vitamins-Minerals (MULTIVITAMIN ADULT) CHEW Chew 1 tablet by mouth daily.    [provider]  ondansetron (ZOFRAN) 4 MG tablet Take 1 tablet (4 mg total) by mouth every 8 (eight) hours as needed for nausea or vomiting. 07/10/20   Marcello Fennel, PA-C      Allergies    Patient has no known allergies.     Review of Systems   Review of Systems  HENT:  Positive for ear pain.     Physical Exam Updated Vital Signs BP 116/87 (BP Location: Left Arm)   Pulse 80   Temp 98.2 F (36.8 C) (Oral)   Resp 18   Ht 5\' 4"  (1.626 m)   Wt 71.2 kg   LMP 03/31/2022   SpO2 100%   BMI 26.95 kg/m  Physical Exam Vitals and nursing note reviewed.  Constitutional:      Appearance: Normal appearance.     Comments: Uncomfortable appearing  HENT:     Head: Normocephalic and atraumatic.     Right Ear: Tympanic membrane, ear canal and external ear normal.     Left Ear: Tympanic membrane and external ear normal. There is no impacted cerumen.     Ears:     Comments: Mild erythema and swelling to the left canal, no drainage.  No pain with pulling on the pinnae, pain with palpation of the tragus.  No mastoid tenderness bilaterally    Mouth/Throat:     Mouth: Mucous membranes are moist.     Pharynx: Oropharynx is clear. No oropharyngeal exudate or posterior oropharyngeal erythema.     Comments: Few dental caries, no dental tenderness to palpation, no drainage in the mouth Eyes:     Extraocular Movements: Extraocular movements intact.     Conjunctiva/sclera: Conjunctivae normal.  Cardiovascular:     Rate and Rhythm:  Normal rate.  Pulmonary:     Effort: Pulmonary effort is normal.  Abdominal:     General: Abdomen is flat.  Musculoskeletal:        General: Normal range of motion.     Cervical back: Normal range of motion and neck supple.  Lymphadenopathy:     Cervical: No cervical adenopathy.  Skin:    General: Skin is warm and dry.  Neurological:     General: No focal deficit present.     Mental Status: She is alert and oriented to person, place, and time.  Psychiatric:        Mood and Affect: Mood normal.        Behavior: Behavior normal.     ED Results / Procedures / Treatments   Labs (all labs ordered are listed, but only abnormal results are displayed) Labs Reviewed - No data to  display  EKG None  Radiology No results found.  Procedures Procedures    Medications Ordered in ED Medications  acetaminophen (TYLENOL) tablet 650 mg (has no administration in time range)  ibuprofen (ADVIL) tablet 600 mg (has no administration in time range)    ED Course/ Medical Decision Making/ A&P                           Medical Decision Making This patient presents to the ED with chief complaint(s) of ear pain with pertinent past medical history of migraines which further complicates the presenting complaint. The complaint involves an extensive differential diagnosis and also carries with it a high risk of complications and morbidity.    The differential diagnosis includes otitis media, otitis externa, TM rupture, no mastoid tenderness making mastoiditis unlikely, no erythema or warmth externally to the ear making cellulitis unlikely, she does have dental caries but no dental tenderness to palpation making periapical abscess unlikely  Additional history obtained: Additional history obtained from N/A Records reviewed Primary Care Documents  ED Course and Reassessment: Upon patient's evaluation she has no evidence of otitis media but does have some pain with palpation of the tragus with mild erythema and swelling of her left canal, concerning for otitis externa.  She was given Tylenol and Motrin for pain and will be given topical eardrops with primary care follow-up.  Independent labs interpretation:  N/A  Independent visualization of imaging: N/A  Consultation: - Consulted or discussed management/test interpretation w/ external professional: N/A  Consideration for admission or further workup: Patient has no emergent conditions requiring admission or further work-up at this time and is stable for discharge with primary care follow-up. Social Determinants of health: N/A    Risk OTC drugs.          Final Clinical Impression(s) / ED Diagnoses Final  diagnoses:  Acute otitis externa of left ear, unspecified type    Rx / DC Orders ED Discharge Orders          Ordered    neomycin-polymyxin-hydrocortisone (CORTISPORIN) 3.5-10000-1 OTIC suspension  4 times daily        04/28/22 0951              Rexford Maus, DO 04/28/22 [phone]

## 2022-04-28 NOTE — Discharge Instructions (Addendum)
You were seen in the emergency department for your ear pain.  You had signs of an ear canal infection and this is treated with antibiotic eardrops.  Prescription was sent to your pharmacy and you should complete this as prescribed.  You can take Tylenol and Motrin as needed for pain.  You should follow-up with your primary doctor in the next few days to have your symptoms rechecked.  You should return to the emergency department if your pain gets significantly worse, you have fevers, you have repetitive vomiting or if you have any other new or concerning symptoms.

## 2022-06-23 ENCOUNTER — Other Ambulatory Visit: Payer: Self-pay

## 2022-06-23 ENCOUNTER — Emergency Department (HOSPITAL_COMMUNITY)
Admission: EM | Admit: 2022-06-23 | Discharge: 2022-06-23 | Disposition: A | Discharge status: Home / Self Care | Payer: BC Managed Care – PPO | Attending: Emergency Medicine | Admitting: Emergency Medicine

## 2022-06-23 ENCOUNTER — Encounter (HOSPITAL_COMMUNITY): Payer: Self-pay

## 2022-06-23 DIAGNOSIS — Z20822 Contact with and (suspected) exposure to covid-19: Secondary | ICD-10-CM | POA: Insufficient documentation

## 2022-06-23 DIAGNOSIS — J029 Acute pharyngitis, unspecified: Secondary | ICD-10-CM | POA: Insufficient documentation

## 2022-06-23 DIAGNOSIS — R059 Cough, unspecified: Secondary | ICD-10-CM | POA: Diagnosis present

## 2022-06-23 LAB — RESP PANEL BY RT-PCR (FLU A&B, COVID) ARPGX2
Influenza A by PCR: NEGATIVE
Influenza B by PCR: NEGATIVE
SARS Coronavirus 2 by RT PCR: NEGATIVE

## 2022-06-23 LAB — GROUP A STREP BY PCR: Group A Strep by PCR: NOT DETECTED

## 2022-06-23 NOTE — ED Triage Notes (Signed)
Pt reports with generalized body aches, headache, cough, and sore throat x 2 days.

## 2022-06-23 NOTE — ED Provider Triage Note (Signed)
Emergency Medicine Provider Triage Evaluation Note  Audrey Gonzalez , a 36 y.o. female  was evaluated in triage.  Pt complains of body aches and cough. 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   Review of Systems  Positive: fever Negative: Shortness of breath  Physical Exam  BP 115/82 (BP Location: Right Arm)   Pulse 78   Temp 98.4 F (36.9 C) (Oral)   Resp 16   Ht 5\' 3"  (1.6 m)   Wt 75.8 kg   SpO2 100%   BMI 29.58 kg/m  Gen:   Awake, no distress   Resp:  Normal effort  MSK:   Moves extremities without difficulty  Other:    Medical Decision Making  Medically screening exam initiated at 8:13 PM.  Appropriate orders placed.  L Pelland was informed that the remainder of the evaluation will be completed by another provider, this initial triage assessment does not replace that evaluation, and the importance of remaining in the ED until their evaluation is complete.     Steele Berg, Elson Areas 06/23/22 2014

## 2022-06-23 NOTE — Discharge Instructions (Signed)
As we discussed, you likely have viral pharyngitis.  Your strep and COVID and flu test are all negative.  Take Tylenol and Motrin for sore throat.  Please stay hydrated.  Rest tomorrow at home  See your doctor for follow-up  Return to ER if you have worse sore throat, trouble breathing, worse cough or fever

## 2022-06-23 NOTE — ED Provider Notes (Signed)
[facility]-EMERGENCY DEPT Provider Note   CSN: [id number] Arrival date & time: 06/23/22  1954     History  Chief Complaint  Patient presents with   Generalized Body Aches   Sore Throat   Cough    Audrey Gonzalez is a 36 y.o. female presenting with bodyaches and sore throat and cough.  Patient states that multiple of her coworkers are sick with similar symptoms.  She began having bodyaches and sore throat for the last 2 to 3 days.  Patient has nonproductive cough as well.  Denies any vomiting.  The history is provided by the patient.       Home Medications Prior to Admission medications   Medication Sig Start Date End Date Taking? Authorizing Provider  amoxicillin-clavulanate (AUGMENTIN) 875-125 MG tablet Take 1 tablet by mouth every 12 (twelve) hours. 05/15/21   Tilden Fossa, MD  HYDROcodone-acetaminophen (NORCO/VICODIN) 5-325 MG tablet Take 1 tablet by mouth every 6 (six) hours as needed. 05/15/21   Tilden Fossa, MD  ibuprofen (ADVIL) 600 MG tablet Take 1 tablet (600 mg total) by mouth every 6 (six) hours as needed. 05/15/21   Tilden Fossa, MD  Multiple Vitamins-Minerals (MULTIVITAMIN ADULT) CHEW Chew 1 tablet by mouth daily.    [provider]  ondansetron (ZOFRAN) 4 MG tablet Take 1 tablet (4 mg total) by mouth every 8 (eight) hours as needed for nausea or vomiting. 07/10/20   Carroll Sage, PA-C      Allergies    Patient has no known allergies.    Review of Systems   Review of Systems  Respiratory:  Positive for cough.   All other systems reviewed and are negative.   Physical Exam Updated Vital Signs BP 115/82 (BP Location: Right Arm)   Pulse 78   Temp 98.4 F (36.9 C) (Oral)   Resp 16   Ht 5\' 3"  (1.6 m)   Wt 75.8 kg   SpO2 100%   BMI 29.58 kg/m  Physical Exam Vitals and nursing note reviewed.  Constitutional:      Appearance: She is well-developed.  HENT:     Head: Normocephalic.     Mouth/Throat:      Mouth: Mucous membranes are moist.     Comments: Posterior pharynx is clear.  No obvious tonsillar exudates Cardiovascular:     Rate and Rhythm: Normal rate and regular rhythm.  Pulmonary:     Effort: Pulmonary effort is normal.     Breath sounds: Normal breath sounds.  Abdominal:     General: Bowel sounds are normal.     Palpations: Abdomen is soft.  Musculoskeletal:     Cervical back: Normal range of motion and neck supple.  Skin:    General: Skin is warm.     Capillary Refill: Capillary refill takes less than 2 seconds.  Neurological:     General: No focal deficit present.     Mental Status: She is alert and oriented to person, place, and time.  Psychiatric:        Mood and Affect: Mood normal.        Behavior: Behavior normal.     ED Results / Procedures / Treatments   Labs (all labs ordered are listed, but only abnormal results are displayed) Labs Reviewed  RESP PANEL BY RT-PCR (FLU A&B, COVID) ARPGX2  GROUP A STREP BY PCR    EKG None  Radiology No results found.  Procedures Procedures    Medications Ordered in ED Medications - No  data to display  ED Course/ Medical Decision Making/ A&P                           Medical Decision Making Audrey L Florek is a 36 y.o. female here presenting with cough and congestion and sore throat.  Patient's COVID and flu and group A strep test are negative.  Patient's tonsils are not enlarged.  I think likely viral pharyngitis.  Gave strict return precautions.   Problems Addressed: Viral pharyngitis: acute illness or injury  Amount and/or Complexity of Data Reviewed Labs: ordered. Decision-making details documented in ED Course.    Final Clinical Impression(s) / ED Diagnoses Final diagnoses:  None    Rx / DC Orders ED Discharge Orders     None         Charlynne Pander, MD 06/23/22 2312

## 2022-07-13 ENCOUNTER — Emergency Department (HOSPITAL_COMMUNITY)
Admission: EM | Admit: 2022-07-13 | Discharge: 2022-07-13 | Disposition: A | Discharge status: Home / Self Care | Payer: BC Managed Care – PPO | Attending: Emergency Medicine | Admitting: Emergency Medicine

## 2022-07-13 ENCOUNTER — Emergency Department (HOSPITAL_COMMUNITY): Payer: BC Managed Care – PPO

## 2022-07-13 ENCOUNTER — Other Ambulatory Visit: Payer: Self-pay

## 2022-07-13 ENCOUNTER — Encounter (HOSPITAL_COMMUNITY): Payer: Self-pay

## 2022-07-13 DIAGNOSIS — Z1152 Encounter for screening for COVID-19: Secondary | ICD-10-CM | POA: Diagnosis not present

## 2022-07-13 DIAGNOSIS — R059 Cough, unspecified: Secondary | ICD-10-CM | POA: Diagnosis present

## 2022-07-13 DIAGNOSIS — J069 Acute upper respiratory infection, unspecified: Secondary | ICD-10-CM | POA: Diagnosis not present

## 2022-07-13 DIAGNOSIS — B338 Other specified viral diseases: Secondary | ICD-10-CM

## 2022-07-13 DIAGNOSIS — Z8616 Personal history of COVID-19: Secondary | ICD-10-CM | POA: Insufficient documentation

## 2022-07-13 DIAGNOSIS — B974 Respiratory syncytial virus as the cause of diseases classified elsewhere: Secondary | ICD-10-CM | POA: Insufficient documentation

## 2022-07-13 LAB — RESP PANEL BY RT-PCR (RSV, FLU A&B, COVID)  RVPGX2
Influenza A by PCR: NEGATIVE
Influenza B by PCR: NEGATIVE
Resp Syncytial Virus by PCR: POSITIVE — AB
SARS Coronavirus 2 by RT PCR: NEGATIVE

## 2022-07-13 MED ORDER — ALBUTEROL SULFATE HFA 108 (90 BASE) MCG/ACT IN AERS: 1.0000 | INHALATION_SPRAY | Freq: Four times a day (QID) | RESPIRATORY_TRACT | 0 refills | Status: AC | PRN

## 2022-07-13 MED ORDER — PREDNISONE 20 MG PO TABS: 60.0000 mg | ORAL_TABLET | Freq: Every day | ORAL | 0 refills | Status: AC

## 2022-07-13 MED ORDER — AZITHROMYCIN 250 MG PO TABS: 250.0000 mg | ORAL_TABLET | Freq: Every day | ORAL | 0 refills | Status: AC

## 2022-07-13 NOTE — Discharge Instructions (Addendum)
Your workup today shows you have RSV.  Chest x-ray shows some evidence of bronchitis.  No other acute concerns.  Continue to drink plenty of fluids.  Take Tylenol Motrin as you need to for fever control or bodyaches.  I have sent albuterol inhaler, prednisone, and an antibiotic into the pharmacy.  For any concerning symptoms return to the emergency room otherwise follow-up with your primary care provider.

## 2022-07-13 NOTE — ED Provider Notes (Signed)
[facility]-EMERGENCY DEPT Provider Note   CSN: [id number] Arrival date & time: 07/13/22  1904     History  Chief Complaint  Patient presents with   Generalized Body Aches   Fever   Migraine   Cough    [place] L Knisely is a 36 y.o. female.  36 year old female presents today for evaluation of URI symptoms ongoing for about 2 weeks.  She was seen in the ED on 5/7.  She was diagnosed with viral pharyngitis.  She states her symptoms have persisted.  Endorses chills.  No definite fever.  Endorses productive cough and bodyaches.  The history is provided by the patient. No language interpreter was used.       Home Medications Prior to Admission medications   Medication Sig Start Date End Date Taking? Authorizing Provider  amoxicillin-clavulanate (AUGMENTIN) 875-125 MG tablet Take 1 tablet by mouth every 12 (twelve) hours. 05/15/21   Tilden Fossa, MD  HYDROcodone-acetaminophen (NORCO/VICODIN) 5-325 MG tablet Take 1 tablet by mouth every 6 (six) hours as needed. 05/15/21   Tilden Fossa, MD  ibuprofen (ADVIL) 600 MG tablet Take 1 tablet (600 mg total) by mouth every 6 (six) hours as needed. 05/15/21   Tilden Fossa, MD  Multiple Vitamins-Minerals (MULTIVITAMIN ADULT) CHEW Chew 1 tablet by mouth daily.    [provider]  ondansetron (ZOFRAN) 4 MG tablet Take 1 tablet (4 mg total) by mouth every 8 (eight) hours as needed for nausea or vomiting. 07/10/20   Carroll Sage, PA-C      Allergies    Patient has no known allergies.    Review of Systems   Review of Systems  Constitutional:  Positive for chills. Negative for activity change and fever.  Eyes:  Negative for visual disturbance.  Cardiovascular:  Negative for chest pain.  Gastrointestinal:  Negative for abdominal pain, nausea and vomiting.  Neurological:  Negative for weakness and light-headedness.  All other systems reviewed and are negative.   Physical Exam Updated Vital Signs BP  126/85 (BP Location: Right Arm)   Pulse (!) 110   Temp 100.2 F (37.9 C) (Oral)   Resp 18   Ht 5\' 3"  (1.6 m)   Wt 75.8 kg   SpO2 100%   BMI 29.58 kg/m  Physical Exam Vitals and nursing note reviewed.  Constitutional:      General: She is not in acute distress.    Appearance: Normal appearance. She is not ill-appearing.  HENT:     Head: Normocephalic and atraumatic.     Nose: Nose normal.  Eyes:     General: No scleral icterus.    Extraocular Movements: Extraocular movements intact.     Conjunctiva/sclera: Conjunctivae normal.  Cardiovascular:     Rate and Rhythm: Normal rate and regular rhythm.     Pulses: Normal pulses.  Pulmonary:     Effort: Pulmonary effort is normal. No respiratory distress.     Breath sounds: Normal breath sounds. No wheezing or rales.  Abdominal:     General: There is no distension.     Tenderness: There is no abdominal tenderness.  Musculoskeletal:        General: Normal range of motion.     Cervical back: Normal range of motion.  Skin:    General: Skin is warm and dry.  Neurological:     General: No focal deficit present.     Mental Status: She is alert. Mental status is at baseline.     ED Results /  Procedures / Treatments   Labs (all labs ordered are listed, but only abnormal results are displayed) Labs Reviewed  RESP PANEL BY RT-PCR (RSV, FLU A&B, COVID)  [id number]    EKG None  Radiology No results found.  Procedures Procedures    Medications Ordered in ED Medications - No data to display  ED Course/ Medical Decision Making/ A&P                           Medical Decision Making Amount and/or Complexity of Data Reviewed Radiology: ordered.  Risk Prescription drug management.   36 year old female presents with persisting URI symptoms.  Tested positive for RSV.  Chest x-ray with evidence of bronchitis.  Overall patient is well-appearing without acute distress.  Will give patient prescription for albuterol, prednisone, and  Zithromax given she is having productive cough and has been ongoing for more than 2 weeks.  Strict return precautions discussed.  Patient voices understanding and is in agreement with plan.   Final Clinical Impression(s) / ED Diagnoses Final diagnoses:  Upper respiratory tract infection, unspecified type  RSV infection    Rx / DC Orders ED Discharge Orders          Ordered    albuterol (VENTOLIN HFA) 108 (90 Base) MCG/ACT inhaler  Every 6 hours PRN        07/13/22 2100    predniSONE (DELTASONE) 20 MG tablet  Daily with breakfast        07/13/22 2100    azithromycin (ZITHROMAX Z-PAK) 250 MG tablet  Daily        07/13/22 2100              Marita Kansas, PA-C 07/13/22 2101    Gwyneth Sprout, MD 07/18/22 1230

## 2022-07-13 NOTE — ED Provider Triage Note (Signed)
Emergency Medicine Provider Triage Evaluation Note  Audrey Gonzalez , a 36 y.o. female  was evaluated in triage.  Pt complains of URI symptoms for the past 2 weeks.  Endorses productive cough.  Denies chest pain, shortness of breath..  Review of Systems  Positive: As above Negative: As above  Physical Exam  BP 126/85 (BP Location: Right Arm)   Pulse (!) 110   Temp 100.2 F (37.9 C) (Oral)   Resp 18   Ht 5\' 3"  (1.6 m)   Wt 75.8 kg   SpO2 100%   BMI 29.58 kg/m  Gen:   Awake, no distress   Resp:  Normal effort  MSK:   Moves extremities without difficulty  Other:    Medical Decision Making  Medically screening exam initiated at 8:22 PM.  Appropriate orders placed.  L Berzins was informed that the remainder of the evaluation will be completed by another provider, this initial triage assessment does not replace that evaluation, and the importance of remaining in the ED until their evaluation is complete.     Steele Berg, PA-C 07/13/22 2026

## 2022-07-13 NOTE — ED Triage Notes (Signed)
Pt reports with cough, headache, generalized body aches, and fevers x 2 weeks.

## 2023-01-04 ENCOUNTER — Encounter (HOSPITAL_COMMUNITY): Payer: Self-pay

## 2023-01-04 ENCOUNTER — Other Ambulatory Visit: Payer: Self-pay

## 2023-01-04 ENCOUNTER — Emergency Department (HOSPITAL_COMMUNITY)
Admission: EM | Admit: 2023-01-04 | Discharge: 2023-01-04 | Disposition: A | Discharge status: Home / Self Care | Payer: BC Managed Care – PPO | Attending: Emergency Medicine | Admitting: Emergency Medicine

## 2023-01-04 ENCOUNTER — Emergency Department (HOSPITAL_COMMUNITY): Payer: BC Managed Care – PPO

## 2023-01-04 DIAGNOSIS — R0789 Other chest pain: Secondary | ICD-10-CM | POA: Insufficient documentation

## 2023-01-04 DIAGNOSIS — E871 Hypo-osmolality and hyponatremia: Secondary | ICD-10-CM | POA: Insufficient documentation

## 2023-01-04 LAB — BASIC METABOLIC PANEL
Anion gap: 9 (ref 5–15)
BUN: 19 mg/dL (ref 6–20)
CO2: 23 mmol/L (ref 22–32)
Calcium: 8.7 mg/dL — ABNORMAL LOW (ref 8.9–10.3)
Chloride: 101 mmol/L (ref 98–111)
Creatinine, Ser: 0.98 mg/dL (ref 0.44–1.00)
GFR, Estimated: >60 mL/min (ref >60)
Glucose, Bld: 98 mg/dL (ref 70–99)
Potassium: 3.6 mmol/L (ref 3.5–5.1)
Sodium: 133 mmol/L — ABNORMAL LOW (ref 135–145)

## 2023-01-04 LAB — CBC
HCT: 34.1 % — ABNORMAL LOW (ref 36.0–46.0)
Hemoglobin: 10.8 g/dL — ABNORMAL LOW (ref 12.0–15.0)
MCH: 26 pg (ref 26.0–34.0)
MCHC: 31.7 g/dL (ref 30.0–36.0)
MCV: 82 fL (ref 80.0–100.0)
Platelets: 296 10*3/uL (ref 150–400)
RBC: 4.16 MIL/uL (ref 3.87–5.11)
RDW: 15.6 % — ABNORMAL HIGH (ref 11.5–15.5)
WBC: 5.5 10*3/uL (ref 4.0–10.5)
nRBC: 0 % (ref 0.0–0.2)

## 2023-01-04 LAB — TROPONIN I (HIGH SENSITIVITY): Troponin I (High Sensitivity): <2 ng/L (ref <18)

## 2023-01-04 NOTE — ED Provider Notes (Signed)
[place] EMERGENCY DEPARTMENT AT [facility] Provider Note   CSN: [id number] Arrival date & time: 01/04/23  2038     History Chief Complaint  Patient presents with   Chest Pain    Audrey Gonzalez is a 37 y.o. female.  Patient presents to the emergency department complaints of chest pain.  She reports that this pain has been present for the last few days and is typically worsened with movement.  Denies any recent injury or trauma to the area.  No known cardiac history or pulmonary abnormalities.  Not currently on any oral or hormonal contraceptive.  No prior history of PE or DVT.  No recent surgery or immobilization.  Denies hemoptysis, leg swelling.   Chest Pain      Home Medications Prior to Admission medications   Medication Sig Start Date End Date Taking? Authorizing Provider  albuterol (VENTOLIN HFA) 108 (90 Base) MCG/ACT inhaler Inhale 1-2 puffs into the lungs every 6 (six) hours as needed for wheezing or shortness of breath. 07/13/22  Yes Ali, Amjad, PA-C  azithromycin (ZITHROMAX Z-PAK) 250 MG tablet Take 1 tablet (250 mg total) by mouth daily. On day 1 take 2 tablets.  Take 1 tablet daily after that. Patient not taking: Reported on 01/04/2023 07/13/22   Marita Kansas, PA-C  HYDROcodone-acetaminophen (NORCO/VICODIN) 5-325 MG tablet Take 1 tablet by mouth every 6 (six) hours as needed. Patient not taking: Reported on 01/04/2023 05/15/21   Tilden Fossa, MD  ibuprofen (ADVIL) 600 MG tablet Take 1 tablet (600 mg total) by mouth every 6 (six) hours as needed. Patient not taking: Reported on 01/04/2023 05/15/21   Tilden Fossa, MD  ondansetron (ZOFRAN) 4 MG tablet Take 1 tablet (4 mg total) by mouth every 8 (eight) hours as needed for nausea or vomiting. Patient not taking: Reported on 01/04/2023 07/10/20   Carroll Sage, PA-C      Allergies    Patient has no known allergies.    Review of Systems   Review of Systems  Cardiovascular:  Positive for chest pain.   All other systems reviewed and are negative.   Physical Exam Updated Vital Signs BP 109/73   Pulse 83   Temp 98.4 F (36.9 C)   Resp 16   Ht 5\' 3"  (1.6 m)   Wt 72.6 kg   SpO2 100%   BMI 28.34 kg/m  Physical Exam Vitals and nursing note reviewed.  Constitutional:      General: She is not in acute distress.    Appearance: She is well-developed.  HENT:     Head: Normocephalic and atraumatic.  Eyes:     Conjunctiva/sclera: Conjunctivae normal.  Cardiovascular:     Rate and Rhythm: Normal rate and regular rhythm.     Heart sounds: No murmur heard. Pulmonary:     Effort: Pulmonary effort is normal. No respiratory distress.     Breath sounds: Normal breath sounds.  Abdominal:     Palpations: Abdomen is soft.     Tenderness: There is no abdominal tenderness.  Musculoskeletal:        General: No swelling.     Cervical back: Neck supple.  Skin:    General: Skin is warm and dry.     Capillary Refill: Capillary refill takes less than 2 seconds.  Neurological:     Mental Status: She is alert.  Psychiatric:        Mood and Affect: Mood normal.     ED Results / Procedures / Treatments  Labs (all labs ordered are listed, but only abnormal results are displayed) Labs Reviewed  BASIC METABOLIC PANEL - Abnormal; Notable for the following components:      Result Value   Sodium 133 (*)    Calcium 8.7 (*)    All other components within normal limits  CBC - Abnormal; Notable for the following components:   Hemoglobin 10.8 (*)    HCT 34.1 (*)    RDW 15.6 (*)    All other components within normal limits  TROPONIN I (HIGH SENSITIVITY)  TROPONIN I (HIGH SENSITIVITY)    EKG None  Radiology DG Chest 2 View  Result Date: 01/04/2023 CLINICAL DATA:  Mid chest pain worsened by movement. EXAM: CHEST - 2 VIEW COMPARISON:  July 13, 2022 FINDINGS: The heart size and mediastinal contours are within normal limits. Both lungs are clear. There is mild dextroscoliosis of the  midthoracic spine. IMPRESSION: No active cardiopulmonary disease. Electronically Signed   By: Aram Candela M.D.   On: 01/04/2023 21:03    Procedures Procedures   Medications Ordered in ED Medications - No data to display  ED Course/ Medical Decision Making/ A&P                           Medical Decision Making Amount and/or Complexity of Data Reviewed Labs: ordered. Radiology: ordered.   This patient presents to the ED for concern of chest pain.  Differential diagnosis includes ACS, PE, bronchitis, viral URI   Lab Tests:  I Ordered, and personally interpreted labs.  The pertinent results include: CBC unremarkable, BMP with mild hyponatremia, troponin negative   Imaging Studies ordered:  I ordered imaging studies including chest x-ray I independently visualized and interpreted imaging which showed no acute cardiopulmonary process I agree with the radiologist interpretation   Problem List / ED Course:  Patient presents emergency department complaints of chest pain.  She reports the pain is been present for the last few days without any other associated symptoms.  Reports the chest pain is not radiating and is primarily in the center of her chest.  Denies any history of acid reflux.  No prior cardiac history or risk factors for this being a cardiac cause imaging reviewed.  Will evaluate with labs including cardiac workup. CBC, BMP, troponin all reassuring without any acute abnormality or derangements.  EKG without any signs of acute ischemia and chest x-ray without any abnormality.  Informed patient of findings and recommendation at this time. PERC rule score 0 at this time. Low risk or concern for PE given patient's clinical history and lack of risk factors.  Informed patient of her low risk factors for a PE making it very unlikely.  Will have patient follow-up with primary care provider outpatient for further evaluation.  Patient is agreeable to treatment plan verbalized  understanding all return precautions. All questions answered prior to patient discharge.  Final Clinical Impression(s) / ED Diagnoses Final diagnoses:  Chest wall pain    Rx / DC Orders ED Discharge Orders     None         Smitty Knudsen, PA-C 01/04/23 2251    Mardene Sayer, MD 01/05/23 1137

## 2023-01-04 NOTE — Discharge Instructions (Signed)
You were seen in the emergency department for chest pain.  Your labs and imaging were all reassuring and no obvious signs of any cardiac or pulmonary abnormalities were noted.  Advised following up with primary care provider or return to the emergency department if your symptoms are worsening.

## 2023-01-04 NOTE — ED Triage Notes (Signed)
Mid chest pains worsened by movement. Says it becomes painful to put on seatbelt and with arm movements.

## 2024-03-25 ENCOUNTER — Other Ambulatory Visit: Payer: Self-pay

## 2024-03-25 ENCOUNTER — Emergency Department (HOSPITAL_COMMUNITY)
Admission: EM | Admit: 2024-03-25 | Discharge: 2024-03-25 | Disposition: A | Discharge status: Home / Self Care | Attending: Emergency Medicine | Admitting: Emergency Medicine

## 2024-03-25 ENCOUNTER — Encounter (HOSPITAL_COMMUNITY): Payer: Self-pay

## 2024-03-25 DIAGNOSIS — M545 Low back pain, unspecified: Secondary | ICD-10-CM | POA: Diagnosis present

## 2024-03-25 LAB — POC URINE PREG, ED: Preg Test, Ur: NEGATIVE

## 2024-03-25 MED ORDER — LIDOCAINE 5 % EX PTCH
1.0000 | MEDICATED_PATCH | CUTANEOUS | Status: DC
  Administered 2024-03-25: 1 via TRANSDERMAL
  Filled 2024-03-25: qty 1

## 2024-03-25 MED ORDER — CYCLOBENZAPRINE HCL 10 MG PO TABS: 10.0000 mg | ORAL_TABLET | Freq: Two times a day (BID) | ORAL | 0 refills | Status: AC | PRN

## 2024-03-25 MED ORDER — LIDOCAINE 5 % EX PTCH: 1.0000 | MEDICATED_PATCH | CUTANEOUS | 0 refills | Status: AC

## 2024-03-25 MED ORDER — HYDROCODONE-ACETAMINOPHEN 5-325 MG PO TABS
1.0000 | ORAL_TABLET | Freq: Once | ORAL | Status: DC
  Filled 2024-03-25: qty 1

## 2024-03-25 MED ORDER — KETOROLAC TROMETHAMINE 15 MG/ML IJ SOLN
15.0000 mg | Freq: Once | INTRAMUSCULAR | Status: AC
  Administered 2024-03-25: 15 mg via INTRAMUSCULAR
  Filled 2024-03-25: qty 1

## 2024-03-25 NOTE — Discharge Instructions (Addendum)
 Patient for the right lower back is overall reassuring.  Suspect he may have pulled a muscle in your lower back or you may have sciatica as we mentioned.  Treatment this time is ibuprofen , applying ice 3-4 times a day and gentle stretching daily.  Please follow-up with your PCP.  I will also send Lidoderm  patches and Flexeril  to your pharmacy to help with symptoms.  Flexeril  is a muscle laxer will make you drowsy recommend taking it at night before sleeping.

## 2024-03-25 NOTE — ED Triage Notes (Signed)
 Pt c/o lower back on her right side last night. States she was unable to lay down due to the pain. She did take some tylenol  w/o relief. No problems with urination.

## 2024-03-25 NOTE — ED Provider Notes (Signed)
 [place] EMERGENCY DEPARTMENT AT [facility] Provider Note   CSN: [id number] Arrival date & time: 03/25/24  1111     Patient presents with: Back Pain  HPI Audrey Gonzalez is a 38 y.o. female presenting for back pain.  Started last night while lying down couch.  Pain is in the right lower back.  Denies urinary or vaginal symptoms.  Denies nausea vomiting diarrhea.  Does hurt with movement.    Back Pain      Prior to Admission medications   Medication Sig Start Date End Date Taking? Authorizing Provider  cyclobenzaprine  (FLEXERIL ) 10 MG tablet Take 1 tablet (10 mg total) by mouth 2 (two) times daily as needed for muscle spasms. 03/25/24  Yes Jadelynn Boylan K, PA-C  lidocaine  (LIDODERM ) 5 % Place 1 patch onto the skin daily. Remove & Discard patch within 12 hours or as directed by MD 03/25/24  Yes Lang Norleen POUR, PA-C  albuterol  (VENTOLIN  HFA) 108 (90 Base) MCG/ACT inhaler Inhale 1-2 puffs into the lungs every 6 (six) hours as needed for wheezing or shortness of breath. 07/13/22   Hildegard, Amjad, PA-C  azithromycin  (ZITHROMAX  Z-PAK) 250 MG tablet Take 1 tablet (250 mg total) by mouth daily. On day 1 take 2 tablets.  Take 1 tablet daily after that. Patient not taking: Reported on 01/04/2023 07/13/22   Hildegard Loge, PA-C  HYDROcodone -acetaminophen  (NORCO/VICODIN) 5-325 MG tablet Take 1 tablet by mouth every 6 (six) hours as needed. Patient not taking: Reported on 01/04/2023 05/15/21   Griselda Norris, MD  ibuprofen  (ADVIL ) 600 MG tablet Take 1 tablet (600 mg total) by mouth every 6 (six) hours as needed. Patient not taking: Reported on 01/04/2023 05/15/21   Griselda Norris, MD  ondansetron  (ZOFRAN ) 4 MG tablet Take 1 tablet (4 mg total) by mouth every 8 (eight) hours as needed for nausea or vomiting. Patient not taking: Reported on 01/04/2023 07/10/20   Waylan Elsie PARAS, PA-C    Allergies: Patient has no known allergies.    Review of Systems  Musculoskeletal:  Positive for back  pain.    Updated Vital Signs BP (!) 121/98 (BP Location: Left Arm)   Pulse 77   Temp 98.4 F (36.9 C) (Oral)   Resp 16   SpO2 100%   Physical Exam Constitutional:      Appearance: Normal appearance.  HENT:     Head: Normocephalic.     Nose: Nose normal.  Eyes:     Conjunctiva/sclera: Conjunctivae normal.  Pulmonary:     Effort: Pulmonary effort is normal.  Abdominal:     General: There is no distension.     Palpations: Abdomen is soft.     Tenderness: There is no abdominal tenderness. There is no right CVA tenderness or left CVA tenderness.  Musculoskeletal:       Back:  Neurological:     Mental Status: She is alert.  Psychiatric:        Mood and Affect: Mood normal.     (all labs ordered are listed, but only abnormal results are displayed) Labs Reviewed  POC URINE PREG, ED    EKG: None  Radiology: No results found.   Procedures   Medications Ordered in the ED  ketorolac  (TORADOL ) 15 MG/ML injection 15 mg (has no administration in time range)  lidocaine  (LIDODERM ) 5 % 1 patch (has no administration in time range)  Medical Decision Making  38 year old well-appearing female presenting for right lower back pain.  Exam notable for tenderness overlying the right sciatic notch.  Considered ectopic but unlikely given negative pregnancy test also considered ovarian torsion or appendicitis but unlikely given the benign abdominal exam with no tenderness and lack of symptoms.  This is likely sciatica or soft tissue injury in the right lower back.  Advised NSAIDs and supportive treatment at home and PCP follow-up.     Final diagnoses:  Low back pain, unspecified back pain laterality, unspecified chronicity, unspecified whether sciatica present    ED Discharge Orders          Ordered    lidocaine  (LIDODERM ) 5 %  Every 24 hours        03/25/24 1249    cyclobenzaprine  (FLEXERIL ) 10 MG tablet  2 times daily PRN        03/25/24  1249               Lang Norleen POUR, PA-C 03/25/24 1249    Dasie Faden, MD 03/27/24 1130
